# Patient Record
Sex: Male | Born: 1985 | Race: White | Hispanic: No | Marital: Married | Smoking: Never smoker
Health system: Southern US, Community
[De-identification: ages and names within clinical notes are randomized; demographics above are authoritative.]

## PROBLEM LIST (undated history)

## (undated) DIAGNOSIS — E785 Hyperlipidemia, unspecified: Secondary | ICD-10-CM

## (undated) HISTORY — PX: VASECTOMY: SHX75

## (undated) HISTORY — DX: Hyperlipidemia, unspecified: E78.5

## (undated) HISTORY — PX: HAND SURGERY: SHX662

---

## 2008-02-19 ENCOUNTER — Encounter (INDEPENDENT_AMBULATORY_CARE_PROVIDER_SITE_OTHER): Payer: Self-pay | Admitting: Orthopedic Surgery

## 2008-02-19 ENCOUNTER — Ambulatory Visit (HOSPITAL_BASED_OUTPATIENT_CLINIC_OR_DEPARTMENT_OTHER): Admission: RE | Admit: 2008-02-19 | Discharge: 2008-02-19 | Payer: Self-pay | Admitting: Orthopedic Surgery

## 2010-06-06 ENCOUNTER — Encounter: Payer: Self-pay | Admitting: Orthopedic Surgery

## 2010-09-27 NOTE — Op Note (Signed)
Jason Osborne, Jason Osborne                ACCOUNT NO.:  [id number]   MEDICAL RECORD NO.:  [id number]          PATIENT TYPE:  AMB   LOCATION:  DSC                          FACILITY:  [facility]   PHYSICIAN:  Cindee Salt, M.D.       DATE OF BIRTH:  January 06, 1986   DATE OF PROCEDURE:  02/19/2008  DATE OF DISCHARGE:                               OPERATIVE REPORT   PREOPERATIVE DIAGNOSIS:  Arteriovenous malformation, left palm.   POSTOPERATIVE DIAGNOSIS:  Arteriovenous malformation, left palm.   OPERATION:  Excision arteriovenous malformation, left thenar eminence.   SURGEON:  Cindee Salt, MD   ASSISTANT:  Artist Pais. Mina Marble, MD   ANESTHESIA:  General.   ANESTHESIOLOGIST:  Zenon Mayo, MD   HISTORY:  The patient is a 25 year old male with a history of a vascular  mass in the thenar eminence into the palm of his left hand.  He is  desirous of surgical excision.  He is aware of risks and complications  including infection, recurrence injury to arteries, nerves, tendons,  especially the palmar cutaneous branch of the median nerve to his  directly in line with the tumor, possibility of recurrence extension,  possibility of dystrophic response, and skin loss.  He has elected to  proceed to have this taken out due to the fact that it is enlarged over  the past several years.  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   PROCEDURE:  The patient is seen in the preoperative area.  The questions  were encouraged and answered.  The extremity marked by both the patient  and surgeon.  Antibiotic given.   PROCEDURE:  The patient was brought to the operating room and a  supraclavicular block was given for pain control.  Postoperatively, a  general anesthetic was given during the procedure under the direction of  Dr. Sampson Goon.  He was prepped using DuraPrep, supine position, left  arm free.  A time-out was taken.  The limb was exsanguinated from the  wrist proximally.  A tourniquet was  placed on the upper arm inflated to  250 mmHg.  A curvilinear incision was made over the mass due to his  position and carried down through the subcutaneous tissue.  Mass was  immediately encountered.  A beefy red multilobulated mass of significant  size was immediately encountered.  With blunt and sharp dissection, this  was dissected free, attempting to protect any neurovascular structure,  bleeders centering, and were cauterized with bipolar.  Dissection was  carried down over the entire metacarpal into the thenar eminence and  over into the superficial to the flexor retinaculum.  Multiple lobulated  masses were removed and sent to pathology.  The wound was copiously  irrigated with saline.  The skin flaps, which were undermined were  cauterized as necessary.  A vessel loop drain was placed to the depths  of each flap and brought out proximally.  The wound closed with  interrupted 4-0 Vicryl Rapide sutures.  A sterile compressive dressing  with thumb spica splint  was applied.  The patient tolerated the procedure  well and was taken to  the recovery room for observation in satisfactory condition.  He will be  discharged to home and return to [facility] in 1 week on  Vicodin.           ______________________________  Cindee Salt, M.D.     GK/MEDQ  D:  02/19/2008  T:  02/20/2008  Job:  [id number]

## 2010-11-10 ENCOUNTER — Inpatient Hospital Stay (INDEPENDENT_AMBULATORY_CARE_PROVIDER_SITE_OTHER)
Admission: RE | Admit: 2010-11-10 | Discharge: 2010-11-10 | Disposition: A | Discharge status: Home / Self Care | Payer: 59 | Source: Ambulatory Visit | Attending: Family Medicine | Admitting: Family Medicine

## 2010-11-10 DIAGNOSIS — J019 Acute sinusitis, unspecified: Secondary | ICD-10-CM

## 2011-02-14 LAB — POCT HEMOGLOBIN-HEMACUE: Hemoglobin: 16.4

## 2013-02-27 ENCOUNTER — Encounter: Payer: Self-pay | Admitting: *Deleted

## 2013-02-27 ENCOUNTER — Other Ambulatory Visit: Payer: Self-pay | Admitting: *Deleted

## 2013-02-27 DIAGNOSIS — E785 Hyperlipidemia, unspecified: Secondary | ICD-10-CM

## 2013-02-27 DIAGNOSIS — R5381 Other malaise: Secondary | ICD-10-CM

## 2013-02-27 DIAGNOSIS — Z8249 Family history of ischemic heart disease and other diseases of the circulatory system: Secondary | ICD-10-CM

## 2013-02-27 DIAGNOSIS — Z79899 Other long term (current) drug therapy: Secondary | ICD-10-CM

## 2013-03-03 ENCOUNTER — Telehealth: Payer: Self-pay | Admitting: Cardiovascular Disease

## 2013-03-03 DIAGNOSIS — Z8249 Family history of ischemic heart disease and other diseases of the circulatory system: Secondary | ICD-10-CM

## 2013-03-03 DIAGNOSIS — E785 Hyperlipidemia, unspecified: Secondary | ICD-10-CM

## 2013-03-03 DIAGNOSIS — R5381 Other malaise: Secondary | ICD-10-CM

## 2013-03-03 DIAGNOSIS — Z79899 Other long term (current) drug therapy: Secondary | ICD-10-CM

## 2013-03-03 LAB — COMPREHENSIVE METABOLIC PANEL
AST: 30 U/L (ref 0–37)
Albumin: 4.5 g/dL (ref 3.5–5.2)
Alkaline Phosphatase: 69 U/L (ref 39–117)
BUN: 12 mg/dL (ref 6–23)
Creat: 1.02 mg/dL (ref 0.50–1.35)
Glucose, Bld: 93 mg/dL (ref 70–99)
Total Bilirubin: 1 mg/dL (ref 0.3–1.2)

## 2013-03-03 LAB — LIPID PANEL
Cholesterol: 172 mg/dL (ref 0–200)
HDL: 45 mg/dL (ref >39)
LDL Cholesterol: 112 mg/dL — ABNORMAL HIGH (ref 0–99)
Total CHOL/HDL Ratio: 3.8 Ratio
VLDL: 15 mg/dL (ref 0–40)

## 2013-03-03 LAB — CBC
HCT: 44.8 % (ref 39.0–52.0)
Hemoglobin: 16 g/dL (ref 13.0–17.0)
MCH: 30.9 pg (ref 26.0–34.0)
MCHC: 35.7 g/dL (ref 30.0–36.0)
MCV: 86.5 fL (ref 78.0–100.0)
RBC: 5.18 MIL/uL (ref 4.22–5.81)
WBC: 5.3 10*3/uL (ref 4.0–10.5)

## 2013-03-03 NOTE — Telephone Encounter (Signed)
Erin w/ [facility] called.  Stated no orders for pt.  Chart reviewed and orders released.

## 2013-03-05 ENCOUNTER — Ambulatory Visit (INDEPENDENT_AMBULATORY_CARE_PROVIDER_SITE_OTHER): Payer: PRIVATE HEALTH INSURANCE | Admitting: Cardiovascular Disease

## 2013-03-05 ENCOUNTER — Encounter: Payer: Self-pay | Admitting: Cardiovascular Disease

## 2013-03-05 VITALS — BP 110/90 | HR 80 | Ht 68.0 in | Wt 216.9 lb

## 2013-03-05 DIAGNOSIS — E785 Hyperlipidemia, unspecified: Secondary | ICD-10-CM

## 2013-03-05 DIAGNOSIS — E782 Mixed hyperlipidemia: Secondary | ICD-10-CM

## 2013-03-05 MED ORDER — ROSUVASTATIN CALCIUM 10 MG PO TABS: 10.0000 mg | ORAL_TABLET | Freq: Every day | ORAL | Status: DC

## 2013-03-05 NOTE — Patient Instructions (Signed)
Your physician recommends that you schedule a follow-up appointment in: 1 YEAR.  Your physician has recommended you make the following change in your medication: Co Q10 200mg  - 300mg  daily toe help with muscle pain.

## 2013-03-05 NOTE — Progress Notes (Addendum)
Patient ID: Jason Osborne, male   DOB: January 16, 1986, 27 y.o.   MRN: [id number]     HPI: Jason Osborne is a 27 y.o. male who presents to the office today for a one-year cardiology evaluation.  Jason Osborne is an 27 year old male who has a history of marked hyperlipidemia with remote cholesterols in excess of 400 which was first recognized when he was approximately 14 or 27 years old. He was started on statins at age 9. While he had been on Lipitor. He had developed some mild ST PT elevation. More recently he's been on lower dose Crestor as well as combination with Zetia. Does have strong family history for coronary artery disease in that his father died of myocardial infarction at age 86. Multiple family members wife taking care of had coronary artery disease as well as significant aortic valve stenosis. He states that his insurance company had recently changed. He been out of Crestor for one week and did note some slight improvement in the vague foot discomfort. He is now back taking Crestor 10 mg and Zetia 10 mg.   Past Medical History  Diagnosis Date  . Hyperlipidemia     History reviewed. No pertinent past surgical history.  Allergies  Allergen Reactions  . Crestor [Rosuvastatin]     Myalgias with high doses    Current Outpatient Prescriptions  Medication Sig Dispense Refill  . aspirin 81 MG tablet Take 81 mg by mouth daily.      Marland Kitchen ezetimibe (ZETIA) 10 MG tablet Take 10 mg by mouth daily.      Marland Kitchen GLUCOSAMINE HCL-MSM PO Take 1 capsule by mouth daily.      . rosuvastatin (CRESTOR) 10 MG tablet Take 10 mg by mouth daily.      . vitamin C (ASCORBIC ACID) 500 MG tablet Take 500 mg by mouth daily.      . rosuvastatin (CRESTOR) 10 MG tablet Take 1 tablet (10 mg total) by mouth daily.  42 tablet  0   No current facility-administered medications for this visit.    History   Social History  . Marital Status: Married    Spouse Name: N/A    Number of Children: N/A  . Years of Education: N/A    Occupational History  . Not on file.   Social History Main Topics  . Smoking status: Never Smoker   . Smokeless tobacco: Never Used  . Alcohol Use: Not on file  . Drug Use: No  . Sexual Activity: Not on file   Other Topics Concern  . Not on file   Social History Narrative  . No narrative on file    Family History  Problem Relation Age of Onset  . Hypertension Mother   . Heart attack Father 79  . Heart disease Father   . Hyperlipidemia Brother   . Hyperlipidemia Maternal Grandfather   . Hypertension Maternal Grandfather   . Cancer - Lung Maternal Grandfather   . Heart disease Paternal Grandfather   . Hyperlipidemia Paternal Grandfather   . Hypertension Paternal Grandfather   . Melanoma Paternal Grandfather   . Hyperlipidemia Brother    Additional social history is notable that he is married. He now has 3 children, ages 59 months, 86 years old, and 27 years old. 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   ROS is negative for fevers, chills or night sweats. He denies palpitations. He denies chest pain. He denies shortness of breath. He denies abdominal pain. He denies muscle soreness. At  times he has noted some vague short-lived myalgias. He denies any hematuria or hematochezia. He denies bleeding.  Other comprehensive 12 point system review is negative.  PE BP 110/90  Pulse 80  Ht 5\' 8"  (1.727 m)  Wt 216 lb 14.4 oz (98.385 kg)  BMI 32.99 kg/m2  Repeat blood pressure when taken by me 120/78 General: Alert, oriented, no distress.  Skin: normal turgor, no rashes HEENT: Normocephalic, atraumatic. Pupils round and reactive; sclera anicteric;no lid lag.  Nose without nasal septal hypertrophy Mouth/Parynx benign; Mallinpatti scale 2 Neck: No JVD, no carotid briuts Lungs: clear to ausculatation and percussion; no wheezing or rales Heart: RRR, s1 s2 normal  No chest wall tenderness Abdomen: soft, nontender; no hepatosplenomehaly, BS+; abdominal aorta nontender  and not dilated by palpation. Pulses 2+ Extremities: no clubbing cyanosis or edema, Homan's sign negative ; no muscle tenderness to palpation  Neurologic: grossly nonfocal Psychologic: normal affect and mood.  ECG: Normal sinus rhythm at 80 beats per minute  LABS:  BMET    Component Value Date/Time   NA 139 03/03/2013 0827   K 4.0 03/03/2013 0827   CL 105 03/03/2013 0827   CO2 26 03/03/2013 0827   GLUCOSE 93 03/03/2013 0827   BUN 12 03/03/2013 0827   CREATININE 1.02 03/03/2013 0827   CALCIUM 9.9 03/03/2013 0827     Hepatic Function Panel     Component Value Date/Time   PROT 7.1 03/03/2013 0827   ALBUMIN 4.5 03/03/2013 0827   AST 30 03/03/2013 0827   ALT 58* 03/03/2013 0827   ALKPHOS 69 03/03/2013 0827   BILITOT 1.0 03/03/2013 0827     CBC    Component Value Date/Time   WBC 5.3 03/03/2013 0827   RBC 5.18 03/03/2013 0827   HGB 16.0 03/03/2013 0827   HCT 44.8 03/03/2013 0827   PLT 209 03/03/2013 0827   MCV 86.5 03/03/2013 0827   MCH 30.9 03/03/2013 0827   MCHC 35.7 03/03/2013 0827   RDW 13.4 03/03/2013 0827     BNP No results found for this basename: probnp    Lipid Panel     Component Value Date/Time   CHOL 172 03/03/2013 0827   TRIG 74 03/03/2013 0827   HDL 45 03/03/2013 0827   CHOLHDL 3.8 03/03/2013 0827   VLDL 15 03/03/2013 0827   LDLCALC 112* 03/03/2013 0827     RADIOLOGY: No results found.    ASSESSMENT AND PLAN: Mr. [name] continues to do fairly well on his current therapy. He now is on low dose Crestor 10 mg in combination with Zetia 10 mg. His most recent LDL is 112 and total cholesterol 172 which is markedly improved from his levels greater than 400 when he was a teenager. He did have very minimal ALT elevation at 58 and AST was normal. I have suggested he try coenzyme Q10 for additional supplementation which may help potential intermittent myalgias. I recommended followup laboratory obtained in 6 months. I will see him in one year for  followup evaluation in the office.   [name], MD, [facility]  03/05/2013 8:31 AM

## 2013-03-06 ENCOUNTER — Encounter: Payer: Self-pay | Admitting: Cardiovascular Disease

## 2013-04-02 ENCOUNTER — Other Ambulatory Visit: Payer: Self-pay | Admitting: *Deleted

## 2013-04-02 ENCOUNTER — Telehealth: Payer: Self-pay | Admitting: *Deleted

## 2013-04-02 DIAGNOSIS — R7989 Other specified abnormal findings of blood chemistry: Secondary | ICD-10-CM

## 2013-04-02 NOTE — Telephone Encounter (Signed)
Left message liver function slightly elevated. Recheck in 2 weeks.lab slip will be given to Pattricia Boss.

## 2013-05-05 ENCOUNTER — Other Ambulatory Visit: Payer: Self-pay | Admitting: Cardiovascular Disease

## 2013-05-06 NOTE — Telephone Encounter (Signed)
Rx was sent to pharmacy electronically. 

## 2013-05-29 ENCOUNTER — Encounter (HOSPITAL_COMMUNITY): Payer: Self-pay | Admitting: Emergency Medicine

## 2013-05-29 ENCOUNTER — Emergency Department (HOSPITAL_COMMUNITY)
Admission: EM | Admit: 2013-05-29 | Discharge: 2013-05-29 | Disposition: A | Discharge status: Home / Self Care | Payer: PRIVATE HEALTH INSURANCE | Source: Home / Self Care | Attending: Family Medicine | Admitting: Family Medicine

## 2013-05-29 DIAGNOSIS — J329 Chronic sinusitis, unspecified: Secondary | ICD-10-CM

## 2013-05-29 MED ORDER — PREDNISONE 10 MG PO TABS: 30.0000 mg | ORAL_TABLET | Freq: Every day | ORAL | Status: DC

## 2013-05-29 MED ORDER — IPRATROPIUM BROMIDE 0.06 % NA SOLN: 2.0000 | Freq: Four times a day (QID) | NASAL | Status: DC

## 2013-05-29 MED ORDER — AMOXICILLIN-POT CLAVULANATE 875-125 MG PO TABS: 1.0000 | ORAL_TABLET | Freq: Two times a day (BID) | ORAL | Status: DC

## 2013-05-29 NOTE — Discharge Instructions (Signed)
Thank you for coming in today. Take both prednisone and Augmentin.  Use up to 2 aleve twice daily for pain.  Use the nasal spray as directed.  Followup with [facility] if not getting better  Sinusitis Sinusitis is redness, soreness, and swelling (inflammation) of the paranasal sinuses. Paranasal sinuses are air pockets within the bones of your face (beneath the eyes, the middle of the forehead, or above the eyes). In healthy paranasal sinuses, mucus is able to drain out, and air is able to circulate through them by way of your nose. However, when your paranasal sinuses are inflamed, mucus and air can become trapped. This can allow bacteria and other germs to grow and cause infection. Sinusitis can develop quickly and last only a short time (acute) or continue over a long period (chronic). Sinusitis that lasts for more than 12 weeks is considered chronic.  CAUSES  Causes of sinusitis include:  Allergies.  Structural abnormalities, such as displacement of the cartilage that separates your nostrils (deviated septum), which can decrease the air flow through your nose and sinuses and affect sinus drainage.  Functional abnormalities, such as when the small hairs (cilia) that line your sinuses and help remove mucus do not work properly or are not present. SYMPTOMS  Symptoms of acute and chronic sinusitis are the same. The primary symptoms are pain and pressure around the affected sinuses. Other symptoms include:  Upper toothache.  Earache.  Headache.  Bad breath.  Decreased sense of smell and taste.  A cough, which worsens when you are lying flat.  Fatigue.  Fever.  Thick drainage from your nose, which often is green and may contain pus (purulent).  Swelling and warmth over the affected sinuses. DIAGNOSIS  Your caregiver will perform a physical exam. During the exam, your caregiver may:  Look in your nose for signs of abnormal growths in your nostrils (nasal polyps).  Tap  over the affected sinus to check for signs of infection.  View the inside of your sinuses (endoscopy) with a special imaging device with a light attached (endoscope), which is inserted into your sinuses. If your caregiver suspects that you have chronic sinusitis, one or more of the following tests may be recommended:  Allergy tests.  Nasal culture A sample of mucus is taken from your nose and sent to a lab and screened for bacteria.  Nasal cytology A sample of mucus is taken from your nose and examined by your caregiver to determine if your sinusitis is related to an allergy. TREATMENT  Most cases of acute sinusitis are related to a viral infection and will resolve on their own within 10 days. Sometimes medicines are prescribed to help relieve symptoms (pain medicine, decongestants, nasal steroid sprays, or saline sprays).  However, for sinusitis related to a bacterial infection, your caregiver will prescribe antibiotic medicines. These are medicines that will help kill the bacteria causing the infection.  Rarely, sinusitis is caused by a fungal infection. In theses cases, your caregiver will prescribe antifungal medicine. For some cases of chronic sinusitis, surgery is needed. Generally, these are cases in which sinusitis recurs more than 3 times per year, despite other treatments. HOME CARE INSTRUCTIONS   Drink plenty of water. Water helps thin the mucus so your sinuses can drain more easily.  Use a humidifier.  Inhale steam 3 to 4 times a day (for example, sit in the bathroom with the shower running).  Apply a warm, moist washcloth to your face 3 to 4 times a day,  or as directed by your caregiver.  Use saline nasal sprays to help moisten and clean your sinuses.  Take over-the-counter or prescription medicines for pain, discomfort, or fever only as directed by your caregiver. SEEK IMMEDIATE MEDICAL CARE IF:  You have increasing pain or severe headaches.  You have nausea, vomiting,  or drowsiness.  You have swelling around your face.  You have vision problems.  You have a stiff neck.  You have difficulty breathing. MAKE SURE YOU:   Understand these instructions.  Will watch your condition.  Will get help right away if you are not doing well or get worse. Document Released: 05/01/2005 Document Revised: 07/24/2011 Document Reviewed: 05/16/2011 [facility] Patient Information 2014 [employer] [place].

## 2013-05-29 NOTE — ED Provider Notes (Signed)
Jason Osborne is a [age] y.o. male who presents to Urgent Care today for sinus congestion headache sinus pressure nasal congestion and sore throat for 3 weeks. Patient was getting better about 2 weeks ago when he gets sick again. Is left-sided worse than his right side. He's tried Sudafed and Tylenol which have not helped. No fevers chills nausea vomiting or diarrhea. Feels well otherwise  History of familial hyperlipidemia Past Medical History  Diagnosis Date  . Hyperlipidemia    History  Substance Use Topics  . Smoking status: Never Smoker   . Smokeless tobacco: Never Used  . Alcohol Use: Not on file   ROS as above Medications: No current facility-administered medications for this encounter.   Current Outpatient Prescriptions  Medication Sig Dispense Refill  . amoxicillin-clavulanate (AUGMENTIN) 875-125 MG per tablet Take 1 tablet by mouth every 12 (twelve) hours.  14 tablet  0  . aspirin 81 MG tablet Take 81 mg by mouth daily.      . CRESTOR 10 MG tablet TAKE 1 TABLET BY MOUTH EVERY DAY AT BEDTIME  30 tablet  10  . ezetimibe (ZETIA) 10 MG tablet Take 10 mg by mouth daily.      Marland Kitchen. GLUCOSAMINE HCL-MSM PO Take 1 capsule by mouth daily.      Marland Kitchen. ipratropium (ATROVENT) 0.06 % nasal spray Place 2 sprays into both nostrils 4 (four) times daily.  15 mL  1  . predniSONE (DELTASONE) 10 MG tablet Take 3 tablets (30 mg total) by mouth daily.  15 tablet  0  . vitamin C (ASCORBIC ACID) 500 MG tablet Take 500 mg by mouth daily.        Exam:  BP 124/83  Pulse 76  Temp(Src) 98.5 F (36.9 C) (Oral)  Resp 16  SpO2 99% Gen: Well NAD HEENT: EOMI,  MMM mild tender palpation left maxillary sinus. Is a turbinates are inflamed bilaterally. Posterior pharynx with cobblestoning. Tympanic membranes are normal appearing bilaterally Lungs: Normal work of breathing. CTABL Heart: RRR no MRG Abd: NABS, Soft. NT, ND Exts: Brisk capillary refill, warm and well perfused.    Assessment and Plan: 28 y.o. male  with sinusitis. Plan to treat with prednisone, and Augmentin. Will use over-the-counter pain medications as needed. [facility]  Discussed warning signs or symptoms. Please see discharge instructions. Patient expresses understanding.    Rodolph BongEvan S Corey, MD 05/29/13 [phone]

## 2013-05-29 NOTE — ED Notes (Signed)
C/o sinus problems States he has a sinus headache, sneezing, congestion, and has pressure in hand and face. OTC medications taking but no relief.

## 2013-06-27 ENCOUNTER — Other Ambulatory Visit: Payer: Self-pay | Admitting: *Deleted

## 2013-06-27 MED ORDER — EZETIMIBE 10 MG PO TABS: 10.0000 mg | ORAL_TABLET | Freq: Every day | ORAL | Status: DC

## 2013-07-02 ENCOUNTER — Other Ambulatory Visit: Payer: Self-pay | Admitting: *Deleted

## 2013-07-02 MED ORDER — ROSUVASTATIN CALCIUM 10 MG PO TABS: ORAL_TABLET | ORAL | Status: DC

## 2013-07-02 NOTE — Telephone Encounter (Signed)
Rx was sent to pharmacy electronically. 

## 2014-03-09 ENCOUNTER — Other Ambulatory Visit: Payer: Self-pay | Admitting: Cardiovascular Disease

## 2014-03-10 NOTE — Telephone Encounter (Signed)
Rx was sent to pharmacy electronically. 

## 2014-04-06 ENCOUNTER — Other Ambulatory Visit: Payer: Self-pay | Admitting: Cardiovascular Disease

## 2014-04-06 NOTE — Telephone Encounter (Signed)
Rx was sent to pharmacy electronically. 

## 2014-06-03 ENCOUNTER — Other Ambulatory Visit: Payer: Self-pay | Admitting: Cardiovascular Disease

## 2014-06-03 MED ORDER — ROSUVASTATIN CALCIUM 10 MG PO TABS: ORAL_TABLET | ORAL | Status: DC

## 2014-06-03 NOTE — Telephone Encounter (Signed)
Pt had refill request, also had appt set up. Refilled Rx to get him to appt date.

## 2014-08-13 ENCOUNTER — Encounter: Payer: Self-pay | Admitting: Cardiovascular Disease

## 2014-08-13 ENCOUNTER — Ambulatory Visit (INDEPENDENT_AMBULATORY_CARE_PROVIDER_SITE_OTHER): Payer: 59 | Admitting: Cardiovascular Disease

## 2014-08-13 VITALS — BP 108/80 | HR 65 | Ht 68.0 in | Wt 222.2 lb

## 2014-08-13 DIAGNOSIS — Z79899 Other long term (current) drug therapy: Secondary | ICD-10-CM

## 2014-08-13 DIAGNOSIS — E669 Obesity, unspecified: Secondary | ICD-10-CM

## 2014-08-13 DIAGNOSIS — E785 Hyperlipidemia, unspecified: Secondary | ICD-10-CM

## 2014-08-13 LAB — LIPID PANEL
Cholesterol: 167 mg/dL (ref 0–200)
HDL: 43 mg/dL (ref >=40)
LDL CALC: 113 mg/dL — AB (ref 0–99)
Total CHOL/HDL Ratio: 3.9 Ratio
Triglycerides: 57 mg/dL (ref <150)
VLDL: 11 mg/dL (ref 0–40)

## 2014-08-13 LAB — CBC
HCT: 45 % (ref 39.0–52.0)
Hemoglobin: 15.6 g/dL (ref 13.0–17.0)
MCH: 30.2 pg (ref 26.0–34.0)
MCHC: 34.7 g/dL (ref 30.0–36.0)
MCV: 87.2 fL (ref 78.0–100.0)
MPV: 10.9 fL (ref 8.6–12.4)
PLATELETS: 234 10*3/uL (ref 150–400)
RBC: 5.16 MIL/uL (ref 4.22–5.81)
RDW: 13.4 % (ref 11.5–15.5)
WBC: 5.3 10*3/uL (ref 4.0–10.5)

## 2014-08-13 LAB — COMPREHENSIVE METABOLIC PANEL
ALBUMIN: 4.5 g/dL (ref 3.5–5.2)
ALT: 64 U/L — AB (ref 0–53)
AST: 37 U/L (ref 0–37)
Alkaline Phosphatase: 82 U/L (ref 39–117)
BUN: 15 mg/dL (ref 6–23)
CALCIUM: 9.4 mg/dL (ref 8.4–10.5)
CO2: 23 meq/L (ref 19–32)
Chloride: 103 mEq/L (ref 96–112)
Creat: 0.83 mg/dL (ref 0.50–1.35)
Glucose, Bld: 90 mg/dL (ref 70–99)
POTASSIUM: 4.6 meq/L (ref 3.5–5.3)
Sodium: 141 mEq/L (ref 135–145)
TOTAL PROTEIN: 7.2 g/dL (ref 6.0–8.3)
Total Bilirubin: 1.1 mg/dL (ref 0.2–1.2)

## 2014-08-13 LAB — TSH: TSH: 0.771 u[IU]/mL (ref 0.350–4.500)

## 2014-08-13 NOTE — Patient Instructions (Addendum)
Your physician recommends that you return for fasting lab work.  Your physician wants you to follow-up in:1 year or sooner if needed. You will receive a reminder letter in the mail two months in advance. If you don't receive a letter, please call our office to schedule the follow-up appointment. 

## 2014-08-13 NOTE — Progress Notes (Signed)
Patient ID: Jason Osborne, male   DOB: 01/22/1986, 29 y.o.   MRN: [id number]     HPI: Jason Osborne is a 29 y.o. male who presents to the office today for an 32 month cardiology evaluation.  Cevin  has a history of marked hyperlipidemia with remote cholesterols in excess of 400 which was first recognized when he was approximately 65 or 29 years old. He was started on statins at age 55. When he had been on Lipitor he had developed some mild LFT elevation. More recently he's been on lower dose Crestor as well as combination with Zetia. He has strong family history for coronary artery disease in that his father died of myocardial infarction at age 42. Multiple family members  had coronary artery disease as well as significant aortic valve stenosis.   He now has 3 children.  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  He has noted some mild weight gain.  When I last saw him, I suggested coenzyme Q10 which may be helpful to reduce myalgias which he had experienced on higher dose of Crestor.   He presents to the office today for evaluation.   Past Medical History  Diagnosis Date  . Hyperlipidemia     History reviewed. No pertinent past surgical history.  Allergies  Allergen Reactions  . Crestor [Rosuvastatin]     Myalgias with high doses    Current Outpatient Prescriptions  Medication Sig Dispense Refill  . Ascorbic Acid (VITAMIN C) 1000 MG tablet Take 2,000 mg by mouth daily.    . Desloratadine (CLARINEX PO) Take 1 tablet by mouth daily.    . montelukast (SINGULAIR) 10 MG tablet Take 10 mg by mouth at bedtime.    . NON FORMULARY Allergy injection    . rosuvastatin (CRESTOR) 10 MG tablet TAKE 1 TABLET BY MOUTH EVERY DAY AT BEDTIME 30 tablet 2  . ZETIA 10 MG tablet TAKE 1 TABLET BY MOUTH DAILY. PLEASE MAKE APPOINTMENT FOR REFILLS 30 tablet 2   No current facility-administered medications for this visit.     History   Social History  . Marital Status: Married    Spouse Name: N/A  . Number of Children: N/A  . Years of Education: N/A   Occupational History  . Not on file.   Social History Main Topics  . Smoking status: Never Smoker   . Smokeless tobacco: Never Used  . Alcohol Use: Not on file  . Drug Use: No  . Sexual Activity: Not on file   Other Topics Concern  . Not on file   Social History Narrative    Family History  Problem Relation Age of Onset  . Hypertension Mother   . Heart attack Father 40  . Heart disease Father   . Hyperlipidemia Brother   . Hyperlipidemia Maternal Grandfather   . Hypertension Maternal Grandfather   . Cancer - Lung Maternal Grandfather   . Heart disease Paternal Grandfather   . Hyperlipidemia Paternal Grandfather   . Hypertension Paternal Grandfather   . Melanoma Paternal Grandfather   . Hyperlipidemia Brother    Additional social history is notable that he is married. He now has 3 children, ages 26 months, 11 years old, and 29 years old. 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  ROS General: Negative; No fevers, chills, or night sweats;  HEENT: Negative; No changes in vision or hearing, sinus congestion, difficulty swallowing  Pulmonary: Negative; No cough, wheezing, shortness of breath, hemoptysis Cardiovascular: Negative; No chest pain, presyncope, syncope, palpitations GI: Negative; No nausea, vomiting, diarrhea, or abdominal pain GU: Negative; No dysuria, hematuria, or difficulty voiding Musculoskeletal: Negative; no myalgias, joint pain, or weakness Hematologic/Oncology: Negative; no easy bruising, bleeding Endocrine: Negative; no heat/cold intolerance; no diabetes Neuro: Negative; no changes in balance, headaches Skin: Negative; No rashes or skin lesions Psychiatric: Negative; No behavioral problems, depression Sleep: Negative; No snoring, daytime sleepiness, hypersomnolence, bruxism, restless legs, hypnogognic  hallucinations, no cataplexy Other comprehensive 14 point system review is negative.   PE BP 108/80 mmHg  Pulse 65  Ht 5\' 8"  (1.727 m)  Wt 222 lb 3.2 oz (100.789 kg)  BMI 33.79 kg/m2  Repeat blood pressure when taken by me 110/78 General: Alert, oriented, no distress.  Skin: normal turgor, no rashes HEENT: Normocephalic, atraumatic. Pupils round and reactive; sclera anicteric;no lid lag.  Nose without nasal septal hypertrophy Mouth/Parynx benign; Mallinpatti scale 2 Neck: No JVD, no carotid bruits Chest wall: Nontender to palpation Lungs: clear to ausculatation and percussion; no wheezing or rales Heart: RRR, s1 s2 normal; no S3 or S4 gallop.  No significant murmur.  No rubs thrills or heaves.  No chest wall tenderness Abdomen: soft, nontender; no hepatosplenomehaly, BS+; abdominal aorta nontender and not dilated by palpation. Back: No CVA tenderness Pulses 2+ Extremities: no clubbing cyanosis or edema, Homan's sign negative ; no muscle tenderness to palpation  Neurologic: grossly nonfocal Psychologic: normal affect and mood.  ECG (independently read by me, and (: Normal sinus rhythm with mild sinus arrhythmia.  Heart rate 65 bpm.  Normal intervals.  Nondiagnostic T changes in lead 3.  October 2014 ECG: Normal sinus rhythm at 80 beats per minute  LABS:  BMET    Component Value Date/Time   NA 139 03/03/2013 0827   K 4.0 03/03/2013 0827   CL 105 03/03/2013 0827   CO2 26 03/03/2013 0827   GLUCOSE 93 03/03/2013 0827   BUN 12 03/03/2013 0827   CREATININE 1.02 03/03/2013 0827   CALCIUM 9.9 03/03/2013 0827     Hepatic Function Panel     Component Value Date/Time   PROT 7.1 03/03/2013 0827   ALBUMIN 4.5 03/03/2013 0827   AST 30 03/03/2013 0827   ALT 58* 03/03/2013 0827   ALKPHOS 69 03/03/2013 0827   BILITOT 1.0 03/03/2013 0827     CBC    Component Value Date/Time   WBC 5.3 03/03/2013 0827   RBC 5.18 03/03/2013 0827   HGB 16.0 03/03/2013 0827   HCT 44.8  03/03/2013 0827   PLT 209 03/03/2013 0827   MCV 86.5 03/03/2013 0827   MCH 30.9 03/03/2013 0827   MCHC 35.7 03/03/2013 0827   RDW 13.4 03/03/2013 0827     BNP No results found for: PROBNP  Lipid Panel     Component Value Date/Time   CHOL 172 03/03/2013 0827   TRIG 74 03/03/2013 0827   HDL 45 03/03/2013 0827   CHOLHDL 3.8 03/03/2013 0827   VLDL 15 03/03/2013 0827   LDLCALC 112* 03/03/2013 0827     RADIOLOGY: No results found.    ASSESSMENT AND PLAN: Mr. Bertell MariaJoshua Hagos has a history of marked hyperlipidemia with cholesterols in excess of 400 during his teenage years, suggesting familial hyperlipidemia.  He continues to do fairly well on his current therapy. He now is on low dose Crestor 10 mg in combination with Zetia 10 mg.  he has not had any laboratory since October  2014.  He is fasting today.  I will obtain a complete set of blood work today.  I had a long discussion with him concerning the new monoclonal antibody PCSK9 inhibitor therapy that is available, particularly in patients with familial hyperlipidemia.  He may be a candidate for enrollment in the ongoing PCSK9 inhibitor drug study since in the past he has been  unable to be titrated to higher dose of statins due to either LFT elevation, or myalgias.  I will contact him regarding the blood work an additional discussion will be made at that time.  I also discussed with him the importance of weight loss and exercise.  His body mass index is 33.8.  His blood pressure is stable.  [name], MD, [facility]  08/13/2014 1:24 PM

## 2014-09-08 ENCOUNTER — Encounter: Payer: Self-pay | Admitting: *Deleted

## 2014-09-11 ENCOUNTER — Other Ambulatory Visit: Payer: Self-pay | Admitting: Cardiovascular Disease

## 2014-09-11 NOTE — Telephone Encounter (Signed)
Rx(s) sent to pharmacy electronically.  

## 2014-10-05 ENCOUNTER — Other Ambulatory Visit: Payer: Self-pay | Admitting: Cardiovascular Disease

## 2014-10-05 NOTE — Telephone Encounter (Signed)
Rx has been sent to the pharmacy electronically. ° °

## 2015-03-02 ENCOUNTER — Encounter (HOSPITAL_COMMUNITY): Payer: Self-pay | Admitting: Emergency Medicine

## 2015-03-02 ENCOUNTER — Emergency Department (HOSPITAL_COMMUNITY)
Admission: EM | Admit: 2015-03-02 | Discharge: 2015-03-02 | Disposition: A | Discharge status: Home / Self Care | Payer: 59 | Source: Home / Self Care | Attending: Emergency Medicine | Admitting: Emergency Medicine

## 2015-03-02 DIAGNOSIS — J018 Other acute sinusitis: Secondary | ICD-10-CM | POA: Diagnosis not present

## 2015-03-02 MED ORDER — PREDNISONE 50 MG PO TABS: ORAL_TABLET | ORAL | Status: DC

## 2015-03-02 MED ORDER — FLUTICASONE PROPIONATE 50 MCG/ACT NA SUSP: 2.0000 | Freq: Every day | NASAL | Status: DC

## 2015-03-02 MED ORDER — TRIAMCINOLONE ACETONIDE 40 MG/ML IJ SUSP
40.0000 mg | Freq: Once | INTRAMUSCULAR | Status: AC
  Administered 2015-03-02: 40 mg via INTRAMUSCULAR

## 2015-03-02 MED ORDER — TRIAMCINOLONE ACETONIDE 40 MG/ML IJ SUSP
INTRAMUSCULAR | Status: AC
  Filled 2015-03-02: qty 1

## 2015-03-02 NOTE — ED Provider Notes (Signed)
CSN: [id number]     Arrival date & time 03/02/15  1716 History   First MD Initiated Contact with Patient 03/02/15 1744     Chief Complaint  Patient presents with  . URI   (Consider location/radiation/quality/duration/timing/severity/associated sxs/prior Treatment) HPI  He is a 29 year old man here for evaluation of nasal congestion. He states the last 3 days he has had nasal congestion, rhinorrhea, bilateral ear discomfort, and sinus pressure. He denies any fevers. No sore throat. He has had sneezing. No cough or shortness of breath. He is taking Singulair and Clarinex for seasonal allergies. He has not used any nasal sprays.  Past Medical History  Diagnosis Date  . Hyperlipidemia    Past Surgical History  Procedure Laterality Date  . Hand surgery Left    Family History  Problem Relation Age of Onset  . Hypertension Mother   . Heart attack Father 46  . Heart disease Father   . Hyperlipidemia Brother   . Hyperlipidemia Maternal Grandfather   . Hypertension Maternal Grandfather   . Cancer - Lung Maternal Grandfather   . Heart disease Paternal Grandfather   . Hyperlipidemia Paternal Grandfather   . Hypertension Paternal Grandfather   . Melanoma Paternal Grandfather   . Hyperlipidemia Brother    Social History  Substance Use Topics  . Smoking status: Never Smoker   . Smokeless tobacco: Never Used  . Alcohol Use: No    Review of Systems As in history of present illness Allergies  Crestor  Home Medications   Prior to Admission medications   Medication Sig Start Date End Date Taking? Authorizing Provider  pseudoephedrine (SUDAFED) 30 MG tablet Take 30 mg by mouth every 4 (four) hours as needed for congestion.   Yes Historical Provider, MD  Ascorbic Acid (VITAMIN C) 1000 MG tablet Take 2,000 mg by mouth daily.    Historical Provider, MD  CRESTOR 10 MG tablet TAKE 1 TABLET BY MOUTH EVERY DAY AT BEDTIME 10/05/14   [name], MD  Desloratadine (CLARINEX PO) Take 1  tablet by mouth daily.    Historical Provider, MD  ezetimibe (ZETIA) 10 MG tablet Take 1 tablet (10 mg total) by mouth daily. 09/11/14   [name], MD  fluticasone (FLONASE) 50 MCG/ACT nasal spray Place 2 sprays into both nostrils daily. 03/02/15   Charm Rings, MD  montelukast (SINGULAIR) 10 MG tablet Take 10 mg by mouth at bedtime.    Historical Provider, MD  NON FORMULARY Allergy injection    Historical Provider, MD  predniSONE (DELTASONE) 50 MG tablet Take 1 pill daily for 5 days. 03/02/15   Charm Rings, MD   Meds Ordered and Administered this Visit   Medications  triamcinolone acetonide (KENALOG-40) injection 40 mg (not administered)    BP 119/86 mmHg  Pulse 75  Temp(Src) 98.1 F (36.7 C) (Oral)  Resp 20  SpO2 95% No data found.   Physical Exam  Constitutional: He appears well-developed and well-nourished. No distress.  HENT:  Mouth/Throat: Oropharynx is clear and moist. No oropharyngeal exudate.  Nasal mucosa is erythematous and edematous. No sinus tenderness. TMs are normal bilaterally.  Eyes: Conjunctivae are normal.  Neck: Neck supple.  Cardiovascular: Normal rate, regular rhythm and normal heart sounds.   No murmur heard. Pulmonary/Chest: Effort normal and breath sounds normal. No respiratory distress. He has no wheezes. He has no rales.  Lymphadenopathy:    He has no cervical adenopathy.    ED Course  Procedures (including critical care time)  Labs Review Labs Reviewed - No data to display  Imaging Review No results found.    MDM   1. Other acute sinusitis    Likely combination of viral and allergic. He will continue his Singulair. I recommended doubling up the Clarinex. Dose of Kenalog given here. Prednisone for 5 days. Recommended Flonase and frequent use of nasal saline spray. Follow up as needed.    Charm RingsErin J Mikeila Burgen, MD 03/02/15 [phone]

## 2015-03-02 NOTE — ED Notes (Signed)
C/o symptoms that started 10/15.  C/o headache, stuffy nose, stuffy ears, blowing yellow sinus drainage in am.  Drainage clears throughout the day.  Denies fever.  No sore throat or ear pain.

## 2015-03-02 NOTE — Discharge Instructions (Signed)
Your sinuses are inflamed. This is likely due to a combination of allergies and a virus. Please continue your Singulair. Take your Clarinex twice a day for the next week. Take prednisone 1 pill daily starting tomorrow. Use Flonase daily for the next week. Use nasal saline spray or saline rinse as often as you can. Follow-up as needed.

## 2015-05-20 DIAGNOSIS — J301 Allergic rhinitis due to pollen: Secondary | ICD-10-CM | POA: Diagnosis not present

## 2015-05-20 DIAGNOSIS — J3089 Other allergic rhinitis: Secondary | ICD-10-CM | POA: Diagnosis not present

## 2015-05-20 DIAGNOSIS — J3081 Allergic rhinitis due to animal (cat) (dog) hair and dander: Secondary | ICD-10-CM | POA: Diagnosis not present

## 2015-06-02 DIAGNOSIS — J3089 Other allergic rhinitis: Secondary | ICD-10-CM | POA: Diagnosis not present

## 2015-06-02 DIAGNOSIS — J301 Allergic rhinitis due to pollen: Secondary | ICD-10-CM | POA: Diagnosis not present

## 2015-06-08 DIAGNOSIS — J3089 Other allergic rhinitis: Secondary | ICD-10-CM | POA: Diagnosis not present

## 2015-06-08 DIAGNOSIS — J3081 Allergic rhinitis due to animal (cat) (dog) hair and dander: Secondary | ICD-10-CM | POA: Diagnosis not present

## 2015-06-08 DIAGNOSIS — J301 Allergic rhinitis due to pollen: Secondary | ICD-10-CM | POA: Diagnosis not present

## 2015-06-15 DIAGNOSIS — J3089 Other allergic rhinitis: Secondary | ICD-10-CM | POA: Diagnosis not present

## 2015-06-15 DIAGNOSIS — J3081 Allergic rhinitis due to animal (cat) (dog) hair and dander: Secondary | ICD-10-CM | POA: Diagnosis not present

## 2015-06-15 DIAGNOSIS — J301 Allergic rhinitis due to pollen: Secondary | ICD-10-CM | POA: Diagnosis not present

## 2015-06-29 DIAGNOSIS — J3081 Allergic rhinitis due to animal (cat) (dog) hair and dander: Secondary | ICD-10-CM | POA: Diagnosis not present

## 2015-06-29 DIAGNOSIS — J3089 Other allergic rhinitis: Secondary | ICD-10-CM | POA: Diagnosis not present

## 2015-06-29 DIAGNOSIS — J301 Allergic rhinitis due to pollen: Secondary | ICD-10-CM | POA: Diagnosis not present

## 2015-07-09 DIAGNOSIS — J3089 Other allergic rhinitis: Secondary | ICD-10-CM | POA: Diagnosis not present

## 2015-07-09 DIAGNOSIS — J3081 Allergic rhinitis due to animal (cat) (dog) hair and dander: Secondary | ICD-10-CM | POA: Diagnosis not present

## 2015-07-09 DIAGNOSIS — J301 Allergic rhinitis due to pollen: Secondary | ICD-10-CM | POA: Diagnosis not present

## 2015-07-20 DIAGNOSIS — J3081 Allergic rhinitis due to animal (cat) (dog) hair and dander: Secondary | ICD-10-CM | POA: Diagnosis not present

## 2015-07-20 DIAGNOSIS — J3089 Other allergic rhinitis: Secondary | ICD-10-CM | POA: Diagnosis not present

## 2015-07-20 DIAGNOSIS — J301 Allergic rhinitis due to pollen: Secondary | ICD-10-CM | POA: Diagnosis not present

## 2015-07-26 ENCOUNTER — Other Ambulatory Visit: Payer: Self-pay | Admitting: Cardiovascular Disease

## 2015-07-26 MED FILL — DESLORATADINE 5 MG TABLET: 5 | 90 days supply | Qty: 90 | Fill #1

## 2015-07-26 MED FILL — MONTELUKAST SOD 10 MG TAB: 10 | 90 days supply | Qty: 90 | Fill #1

## 2015-07-26 MED FILL — ROSUVASTATIN CALCIUM 10 MG: 10 | 30 days supply | Qty: 30 | Fill #3

## 2015-07-26 NOTE — Telephone Encounter (Signed)
REFILL 

## 2015-07-29 DIAGNOSIS — J301 Allergic rhinitis due to pollen: Secondary | ICD-10-CM | POA: Diagnosis not present

## 2015-07-29 DIAGNOSIS — J3081 Allergic rhinitis due to animal (cat) (dog) hair and dander: Secondary | ICD-10-CM | POA: Diagnosis not present

## 2015-07-29 DIAGNOSIS — J3089 Other allergic rhinitis: Secondary | ICD-10-CM | POA: Diagnosis not present

## 2015-07-31 ENCOUNTER — Ambulatory Visit (INDEPENDENT_AMBULATORY_CARE_PROVIDER_SITE_OTHER): Payer: 59 | Admitting: Family Medicine

## 2015-07-31 ENCOUNTER — Telehealth: Payer: Self-pay

## 2015-07-31 VITALS — BP 120/76 | HR 77 | Temp 98.2°F | Resp 14 | Ht 68.0 in | Wt 226.0 lb

## 2015-07-31 DIAGNOSIS — R05 Cough: Secondary | ICD-10-CM | POA: Diagnosis not present

## 2015-07-31 DIAGNOSIS — R52 Pain, unspecified: Secondary | ICD-10-CM | POA: Diagnosis not present

## 2015-07-31 DIAGNOSIS — R69 Illness, unspecified: Principal | ICD-10-CM

## 2015-07-31 DIAGNOSIS — R059 Cough, unspecified: Secondary | ICD-10-CM

## 2015-07-31 DIAGNOSIS — J111 Influenza due to unidentified influenza virus with other respiratory manifestations: Secondary | ICD-10-CM | POA: Diagnosis not present

## 2015-07-31 DIAGNOSIS — Z20828 Contact with and (suspected) exposure to other viral communicable diseases: Secondary | ICD-10-CM | POA: Diagnosis not present

## 2015-07-31 MED ORDER — OSELTAMIVIR PHOSPHATE 75 MG PO CAPS: 75.0000 mg | ORAL_CAPSULE | Freq: Two times a day (BID) | ORAL | Status: DC

## 2015-07-31 NOTE — Telephone Encounter (Signed)
Pt was just seen by dr Neva Seatgreene and meds were  [facility] , [facility]   Best number [phone]

## 2015-07-31 NOTE — Addendum Note (Signed)
Addended by: Areta HaberMOREHEAD, Ariellah Faust B on: 07/31/2015 03:38 PM   Modules accepted: Orders

## 2015-07-31 NOTE — Progress Notes (Signed)
Subjective:    Patient ID: Jason Osborne, male    DOB: 1985-08-17, 30 y.o.   MRN: [id number] By signing my name below, I, Javier Dockerobert Ryan HalasBertell Osborne, attest that this documentation has been prepared under the direction and in the presence of Meredith StaggersJeffrey Ader Fritze, MD. Electronically Signed: Javier Dockerobert Ryan Halas, ER Scribe. 07/31/2015. 2:10 PM.  Chief Complaint  Patient presents with  . Chills    1 day - dtr diagnosed with flu x 2 days  . Fever  . Otalgia    pressure in both ears  . Nasal Congestion    HPI HPI Comments: Jason MariaJoshua Osborne is a 30 y.o. male who [facility] of myalgias, ear pain, HA, mild cough, sinus pressure, chills, and night sweats since last night. His daughter had flu last week, and his wife had the flu the week before. He had a fever of 99.5 last night.    Patient Active Problem List   Diagnosis Date Noted  . Mild obesity 08/13/2014  . Hyperlipidemia 03/05/2013   Past Medical History  Diagnosis Date  . Hyperlipidemia    Past Surgical History  Procedure Laterality Date  . Hand surgery Left   . Vasectomy     No Known Allergies Prior to Admission medications   Medication Sig Start Date End Date Taking? Authorizing Provider  Desloratadine (CLARINEX PO) Take 1 tablet by mouth daily.   Yes Historical Provider, MD  fluticasone (FLONASE) 50 MCG/ACT nasal spray Place 2 sprays into both nostrils daily. 03/02/15  Yes Charm RingsErin J Honig, MD  montelukast (SINGULAIR) 10 MG tablet Take 10 mg by mouth at bedtime.   Yes Historical Provider, MD  NON FORMULARY Allergy injection   Yes Historical Provider, MD  pseudoephedrine (SUDAFED) 30 MG tablet Take 30 mg by mouth every 4 (four) hours as needed for congestion.   Yes Historical Provider, MD  rosuvastatin (CRESTOR) 10 MG tablet TAKE 1 TABLET BY MOUTH AT BEDTIME 07/26/15  Yes Lennette Biharihomas A Kelly, MD  predniSONE (DELTASONE) 50 MG tablet Take 1 pill daily for 5 days. Patient not taking: Reported on 07/31/2015 03/02/15   Charm RingsErin J Honig, MD    Social History   Social History  . Marital Status: Married    Spouse Name: N/A  . Number of Children: N/A  . Years of Education: N/A   Occupational History  . Not on file.   Social History Main Topics  . Smoking status: Never Smoker   . Smokeless tobacco: Never Used  . Alcohol Use: No  . Drug Use: No  . Sexual Activity: Not on file   Other Topics Concern  . Not on file   Social History Narrative    Review of Systems  Constitutional: Positive for fever and chills.  HENT: Positive for congestion and sinus pressure.   Respiratory: Positive for cough. Negative for shortness of breath.   Musculoskeletal: Positive for myalgias.      Objective:  BP 120/76 mmHg  Pulse 77  Temp(Src) 98.2 F (36.8 C) (Oral)  Resp 14  Ht 5\' 8"  (1.727 m)  Wt 226 lb (102.513 kg)  BMI 34.37 kg/m2  SpO2 98%  Physical Exam  Constitutional: He is oriented to person, place, and time. He appears well-developed and well-nourished. No distress.  HENT:  Head: Normocephalic and atraumatic.  Right Ear: Tympanic membrane, external ear and ear canal normal.  Left Ear: Tympanic membrane, external ear and ear canal normal.  Nose: No rhinorrhea.  Mouth/Throat: Oropharynx is clear and moist and mucous  membranes are normal. No oropharyngeal exudate or posterior oropharyngeal erythema.  Edematous turbinates. Sinuses non tender. No lymphadenopathy.   Eyes: Conjunctivae are normal. Pupils are equal, round, and reactive to light.  Neck: Neck supple.  Cardiovascular: Normal rate, regular rhythm, normal heart sounds and intact distal pulses.   No murmur heard. Pulmonary/Chest: Effort normal and breath sounds normal. No respiratory distress. He has no wheezes. He has no rhonchi. He has no rales.  Abdominal: Soft. There is no tenderness.  Musculoskeletal: Normal range of motion.  Lymphadenopathy:    He has no cervical adenopathy.  Neurological: He is alert and oriented to person, place, and time. Coordination  normal.  Skin: Skin is warm and dry. No rash noted. He is not diaphoretic.  Psychiatric: He has a normal mood and affect. His behavior is normal.  Nursing note and vitals reviewed.     Assessment & Plan:   Jason Osborne is a 30 y.o. male Influenza-like illness - Plan: oseltamivir (TAMIFLU) 75 MG capsule  Exposure to influenza - Plan: oseltamivir (TAMIFLU) 75 MG capsule  Cough  Body aches   Exposure to influenza, with typical flu symptoms of cough and body aches. Discussed flu testing, but deferred based on typical symptoms and exposure to sick contact. Tamiflu discussed as option, chose to have Tamiflu prescription. Side effects discussed. Symptomatic care discussed, RTC precautions.   Meds ordered this encounter  Medications  . oseltamivir (TAMIFLU) 75 MG capsule    Sig: Take 1 capsule (75 mg total) by mouth 2 (two) times daily.    Dispense:  10 capsule    Refill:  0   Patient Instructions       IF you received an x-ray today, you will receive an invoice from [facility]. Please contact [facility] at [phone] with questions or concerns regarding your invoice.   IF you received labwork today, you will receive an invoice from [employer]. Please contact [facility] at [phone] with questions or concerns regarding your invoice.   Our billing staff will not be able to assist you with questions regarding bills from these companies.  You will be contacted with the lab results as soon as they are available. The fastest way to get your results is to activate your My Chart account. Instructions are located on the last page of this paperwork. If you have not heard from [place] regarding the results in 2 weeks, please contact this office.    Can start Tamiflu as discussed. Saline nasal spray at least 4 times per day if needed for nasal congestion, over the counter mucinex or mucinex DM as needed for cough, tylenol or ibuprofen over the  counter for fever and body aches, and drink plenty of fluids. Other information as in instructions below.  Return to the clinic or go to the nearest emergency room if any of your symptoms worsen or new symptoms occur.  Influenza, Adult Influenza ("the flu") is a viral infection of the respiratory tract. It occurs more often in winter months because people spend more time in close contact with one another. Influenza can make you feel very sick. Influenza easily spreads from person to person (contagious). CAUSES  Influenza is caused by a virus that infects the respiratory tract. You can catch the virus by breathing in droplets from an infected person's cough or sneeze. You can also catch the virus by touching something that was recently contaminated with the virus and then touching your mouth, nose, or eyes. RISKS AND COMPLICATIONS You  may be at risk for a more severe case of influenza if you smoke cigarettes, have diabetes, have chronic heart disease (such as heart failure) or lung disease (such as asthma), or if you have a weakened immune system. Elderly people and pregnant women are also at risk for more serious infections. The most common problem of influenza is a lung infection (pneumonia). Sometimes, this problem can require emergency medical care and may be life threatening. SIGNS AND SYMPTOMS  Symptoms typically last 4 to 10 days and may include:  Fever.  Chills.  Headache, body aches, and muscle aches.  Sore throat.  Chest discomfort and cough.  Poor appetite.  Weakness or feeling tired.  Dizziness.  Nausea or vomiting. DIAGNOSIS  Diagnosis of influenza is often made based on your history and a physical exam. A nose or throat swab test can be done to confirm the diagnosis. TREATMENT  In mild cases, influenza goes away on its own. Treatment is directed at relieving symptoms. For more severe cases, your health care provider may prescribe antiviral medicines to shorten the  sickness. Antibiotic medicines are not effective because the infection is caused by a virus, not by bacteria. HOME CARE INSTRUCTIONS  Take medicines only as directed by your health care provider.  Use a cool mist humidifier to make breathing easier.  Get plenty of rest until your temperature returns to normal. This usually takes 3 to 4 days.  Drink enough fluid to keep your urine clear or pale yellow.  Cover yourmouth and nosewhen coughing or sneezing,and wash your handswellto prevent thevirusfrom spreading.  Stay homefromwork orschool untilthe fever is gonefor at least 63full day. PREVENTION  An annual influenza vaccination (flu shot) is the best way to avoid getting influenza. An annual flu shot is now routinely recommended for all adults in the [place] SEEK MEDICAL CARE IF:  You experiencechest pain, yourcough worsens,or you producemore mucus.  Youhave nausea,vomiting, ordiarrhea.  Your fever returns or gets worse. SEEK IMMEDIATE MEDICAL CARE IF:  You havetrouble breathing, you become short of breath,or your skin ornails becomebluish.  You have severe painor stiffnessin the neck.  You develop a sudden headache, or pain in the face or ear.  You have nausea or vomiting that you cannot control. MAKE SURE YOU:   Understand these instructions.  Will watch your condition.  Will get help right away if you are not doing well or get worse.   This information is not intended to replace advice given to you by your health care provider. Make sure you discuss any questions you have with your health care provider.   Document Released: 04/28/2000 Document Revised: 05/22/2014 Document Reviewed: 07/31/2011 Elsevier Interactive Patient Education [employer]       I personally performed the services described in this documentation, which was scribed in my presence. The recorded information has been reviewed and considered, and addended by me as needed.

## 2015-07-31 NOTE — Patient Instructions (Addendum)
IF you received an x-ray today, you will receive an invoice from [facility]. Please contact [facility] at [phone] with questions or concerns regarding your invoice.   IF you received labwork today, you will receive an invoice from [employer]. Please contact [facility] at [phone] with questions or concerns regarding your invoice.   Our billing staff will not be able to assist you with questions regarding bills from these companies.  You will be contacted with the lab results as soon as they are available. The fastest way to get your results is to activate your My Chart account. Instructions are located on the last page of this paperwork. If you have not heard from [place] regarding the results in 2 weeks, please contact this office.    Can start Tamiflu as discussed. Saline nasal spray at least 4 times per day if needed for nasal congestion, over the counter mucinex or mucinex DM as needed for cough, tylenol or ibuprofen over the counter for fever and body aches, and drink plenty of fluids. Other information as in instructions below.  Return to the clinic or go to the nearest emergency room if any of your symptoms worsen or new symptoms occur.  Influenza, Adult Influenza ("the flu") is a viral infection of the respiratory tract. It occurs more often in winter months because people spend more time in close contact with one another. Influenza can make you feel very sick. Influenza easily spreads from person to person (contagious). CAUSES  Influenza is caused by a virus that infects the respiratory tract. You can catch the virus by breathing in droplets from an infected person's cough or sneeze. You can also catch the virus by touching something that was recently contaminated with the virus and then touching your mouth, nose, or eyes. RISKS AND COMPLICATIONS You may be at risk for a more severe case of influenza if you smoke cigarettes, have  diabetes, have chronic heart disease (such as heart failure) or lung disease (such as asthma), or if you have a weakened immune system. Elderly people and pregnant women are also at risk for more serious infections. The most common problem of influenza is a lung infection (pneumonia). Sometimes, this problem can require emergency medical care and may be life threatening. SIGNS AND SYMPTOMS  Symptoms typically last 4 to 10 days and may include:  Fever.  Chills.  Headache, body aches, and muscle aches.  Sore throat.  Chest discomfort and cough.  Poor appetite.  Weakness or feeling tired.  Dizziness.  Nausea or vomiting. DIAGNOSIS  Diagnosis of influenza is often made based on your history and a physical exam. A nose or throat swab test can be done to confirm the diagnosis. TREATMENT  In mild cases, influenza goes away on its own. Treatment is directed at relieving symptoms. For more severe cases, your health care provider may prescribe antiviral medicines to shorten the sickness. Antibiotic medicines are not effective because the infection is caused by a virus, not by bacteria. HOME CARE INSTRUCTIONS  Take medicines only as directed by your health care provider.  Use a cool mist humidifier to make breathing easier.  Get plenty of rest until your temperature returns to normal. This usually takes 3 to 4 days.  Drink enough fluid to keep your urine clear or pale yellow.  Cover yourmouth and nosewhen coughing or sneezing,and wash your handswellto prevent thevirusfrom spreading.  Stay homefromwork orschool untilthe fever is gonefor at least 30full day. PREVENTION  An  annual influenza vaccination (flu shot) is the best way to avoid getting influenza. An annual flu shot is now routinely recommended for all adults in the [place] SEEK MEDICAL CARE IF:  You experiencechest pain, yourcough worsens,or you producemore mucus.  Youhave nausea,vomiting, ordiarrhea.  Your  fever returns or gets worse. SEEK IMMEDIATE MEDICAL CARE IF:  You havetrouble breathing, you become short of breath,or your skin ornails becomebluish.  You have severe painor stiffnessin the neck.  You develop a sudden headache, or pain in the face or ear.  You have nausea or vomiting that you cannot control. MAKE SURE YOU:   Understand these instructions.  Will watch your condition.  Will get help right away if you are not doing well or get worse.   This information is not intended to replace advice given to you by your health care provider. Make sure you discuss any questions you have with your health care provider.   Document Released: 04/28/2000 Document Revised: 05/22/2014 Document Reviewed: 07/31/2011 Elsevier Interactive Patient Education [employer]2016 Elsevier Inc.

## 2015-08-20 DIAGNOSIS — J301 Allergic rhinitis due to pollen: Secondary | ICD-10-CM | POA: Diagnosis not present

## 2015-08-20 DIAGNOSIS — J3081 Allergic rhinitis due to animal (cat) (dog) hair and dander: Secondary | ICD-10-CM | POA: Diagnosis not present

## 2015-08-20 DIAGNOSIS — J3089 Other allergic rhinitis: Secondary | ICD-10-CM | POA: Diagnosis not present

## 2015-09-02 DIAGNOSIS — J301 Allergic rhinitis due to pollen: Secondary | ICD-10-CM | POA: Diagnosis not present

## 2015-09-02 DIAGNOSIS — J3081 Allergic rhinitis due to animal (cat) (dog) hair and dander: Secondary | ICD-10-CM | POA: Diagnosis not present

## 2015-09-02 DIAGNOSIS — J3089 Other allergic rhinitis: Secondary | ICD-10-CM | POA: Diagnosis not present

## 2015-09-02 MED FILL — ROSUVASTATIN CALCIUM 10 MG: 10 | 30 days supply | Qty: 30 | Fill #0

## 2015-09-15 DIAGNOSIS — J3081 Allergic rhinitis due to animal (cat) (dog) hair and dander: Secondary | ICD-10-CM | POA: Diagnosis not present

## 2015-09-15 DIAGNOSIS — J301 Allergic rhinitis due to pollen: Secondary | ICD-10-CM | POA: Diagnosis not present

## 2015-09-15 DIAGNOSIS — J3089 Other allergic rhinitis: Secondary | ICD-10-CM | POA: Diagnosis not present

## 2015-09-24 DIAGNOSIS — J301 Allergic rhinitis due to pollen: Secondary | ICD-10-CM | POA: Diagnosis not present

## 2015-09-24 DIAGNOSIS — J3081 Allergic rhinitis due to animal (cat) (dog) hair and dander: Secondary | ICD-10-CM | POA: Diagnosis not present

## 2015-09-24 DIAGNOSIS — J3089 Other allergic rhinitis: Secondary | ICD-10-CM | POA: Diagnosis not present

## 2015-10-05 ENCOUNTER — Other Ambulatory Visit: Payer: Self-pay | Admitting: Cardiovascular Disease

## 2015-10-05 MED FILL — ROSUVASTATIN CALCIUM 10 MG: 10 | 90 days supply | Qty: 90 | Fill #0

## 2015-10-05 NOTE — Telephone Encounter (Signed)
Rx(s) sent to pharmacy electronically.  

## 2015-10-07 DIAGNOSIS — J3081 Allergic rhinitis due to animal (cat) (dog) hair and dander: Secondary | ICD-10-CM | POA: Diagnosis not present

## 2015-10-07 DIAGNOSIS — J301 Allergic rhinitis due to pollen: Secondary | ICD-10-CM | POA: Diagnosis not present

## 2015-10-07 DIAGNOSIS — J3089 Other allergic rhinitis: Secondary | ICD-10-CM | POA: Diagnosis not present

## 2015-10-13 DIAGNOSIS — J301 Allergic rhinitis due to pollen: Secondary | ICD-10-CM | POA: Diagnosis not present

## 2015-10-13 DIAGNOSIS — J3089 Other allergic rhinitis: Secondary | ICD-10-CM | POA: Diagnosis not present

## 2015-10-13 DIAGNOSIS — J3081 Allergic rhinitis due to animal (cat) (dog) hair and dander: Secondary | ICD-10-CM | POA: Diagnosis not present

## 2015-10-22 DIAGNOSIS — J3081 Allergic rhinitis due to animal (cat) (dog) hair and dander: Secondary | ICD-10-CM | POA: Diagnosis not present

## 2015-10-22 DIAGNOSIS — J301 Allergic rhinitis due to pollen: Secondary | ICD-10-CM | POA: Diagnosis not present

## 2015-10-22 DIAGNOSIS — J3089 Other allergic rhinitis: Secondary | ICD-10-CM | POA: Diagnosis not present

## 2015-10-27 MED FILL — MONTELUKAST SOD 10 MG TAB: 10 | 90 days supply | Qty: 90 | Fill #0

## 2015-10-27 MED FILL — DESLORATADINE 5 MG TABLET: 5 | 90 days supply | Qty: 90 | Fill #0

## 2015-11-04 DIAGNOSIS — J3089 Other allergic rhinitis: Secondary | ICD-10-CM | POA: Diagnosis not present

## 2015-11-05 DIAGNOSIS — J3081 Allergic rhinitis due to animal (cat) (dog) hair and dander: Secondary | ICD-10-CM | POA: Diagnosis not present

## 2015-11-05 DIAGNOSIS — J3089 Other allergic rhinitis: Secondary | ICD-10-CM | POA: Diagnosis not present

## 2015-11-05 DIAGNOSIS — J301 Allergic rhinitis due to pollen: Secondary | ICD-10-CM | POA: Diagnosis not present

## 2015-11-17 DIAGNOSIS — J3089 Other allergic rhinitis: Secondary | ICD-10-CM | POA: Diagnosis not present

## 2015-11-17 DIAGNOSIS — J301 Allergic rhinitis due to pollen: Secondary | ICD-10-CM | POA: Diagnosis not present

## 2015-11-17 DIAGNOSIS — J3081 Allergic rhinitis due to animal (cat) (dog) hair and dander: Secondary | ICD-10-CM | POA: Diagnosis not present

## 2015-11-29 DIAGNOSIS — J3089 Other allergic rhinitis: Secondary | ICD-10-CM | POA: Diagnosis not present

## 2015-11-29 DIAGNOSIS — J3081 Allergic rhinitis due to animal (cat) (dog) hair and dander: Secondary | ICD-10-CM | POA: Diagnosis not present

## 2015-11-29 DIAGNOSIS — J301 Allergic rhinitis due to pollen: Secondary | ICD-10-CM | POA: Diagnosis not present

## 2015-12-10 DIAGNOSIS — J3089 Other allergic rhinitis: Secondary | ICD-10-CM | POA: Diagnosis not present

## 2015-12-10 DIAGNOSIS — J301 Allergic rhinitis due to pollen: Secondary | ICD-10-CM | POA: Diagnosis not present

## 2015-12-10 DIAGNOSIS — J3081 Allergic rhinitis due to animal (cat) (dog) hair and dander: Secondary | ICD-10-CM | POA: Diagnosis not present

## 2015-12-29 DIAGNOSIS — J3081 Allergic rhinitis due to animal (cat) (dog) hair and dander: Secondary | ICD-10-CM | POA: Diagnosis not present

## 2015-12-29 DIAGNOSIS — J3089 Other allergic rhinitis: Secondary | ICD-10-CM | POA: Diagnosis not present

## 2015-12-29 DIAGNOSIS — J301 Allergic rhinitis due to pollen: Secondary | ICD-10-CM | POA: Diagnosis not present

## 2016-01-05 MED FILL — ROSUVASTATIN CALCIUM 10 MG: 10 | 90 days supply | Qty: 90 | Fill #1

## 2016-01-20 DIAGNOSIS — J301 Allergic rhinitis due to pollen: Secondary | ICD-10-CM | POA: Diagnosis not present

## 2016-01-20 DIAGNOSIS — J3089 Other allergic rhinitis: Secondary | ICD-10-CM | POA: Diagnosis not present

## 2016-01-20 DIAGNOSIS — J3081 Allergic rhinitis due to animal (cat) (dog) hair and dander: Secondary | ICD-10-CM | POA: Diagnosis not present

## 2016-02-01 DIAGNOSIS — J3089 Other allergic rhinitis: Secondary | ICD-10-CM | POA: Diagnosis not present

## 2016-02-01 DIAGNOSIS — J3081 Allergic rhinitis due to animal (cat) (dog) hair and dander: Secondary | ICD-10-CM | POA: Diagnosis not present

## 2016-02-01 DIAGNOSIS — J301 Allergic rhinitis due to pollen: Secondary | ICD-10-CM | POA: Diagnosis not present

## 2016-02-01 MED FILL — MONTELUKAST SOD 10 MG TAB: 10 | 90 days supply | Qty: 90 | Fill #1

## 2016-02-01 MED FILL — DESLORATADINE 5 MG TABLET: 5 | 90 days supply | Qty: 90 | Fill #1

## 2016-02-15 DIAGNOSIS — J3081 Allergic rhinitis due to animal (cat) (dog) hair and dander: Secondary | ICD-10-CM | POA: Diagnosis not present

## 2016-02-15 DIAGNOSIS — J3089 Other allergic rhinitis: Secondary | ICD-10-CM | POA: Diagnosis not present

## 2016-02-15 DIAGNOSIS — J301 Allergic rhinitis due to pollen: Secondary | ICD-10-CM | POA: Diagnosis not present

## 2016-02-25 DIAGNOSIS — J3089 Other allergic rhinitis: Secondary | ICD-10-CM | POA: Diagnosis not present

## 2016-02-25 DIAGNOSIS — J3081 Allergic rhinitis due to animal (cat) (dog) hair and dander: Secondary | ICD-10-CM | POA: Diagnosis not present

## 2016-02-25 DIAGNOSIS — J301 Allergic rhinitis due to pollen: Secondary | ICD-10-CM | POA: Diagnosis not present

## 2016-03-01 DIAGNOSIS — J301 Allergic rhinitis due to pollen: Secondary | ICD-10-CM | POA: Diagnosis not present

## 2016-03-01 DIAGNOSIS — J3089 Other allergic rhinitis: Secondary | ICD-10-CM | POA: Diagnosis not present

## 2016-03-01 DIAGNOSIS — J3081 Allergic rhinitis due to animal (cat) (dog) hair and dander: Secondary | ICD-10-CM | POA: Diagnosis not present

## 2016-03-23 DIAGNOSIS — J3081 Allergic rhinitis due to animal (cat) (dog) hair and dander: Secondary | ICD-10-CM | POA: Diagnosis not present

## 2016-03-23 DIAGNOSIS — J301 Allergic rhinitis due to pollen: Secondary | ICD-10-CM | POA: Diagnosis not present

## 2016-03-23 DIAGNOSIS — J3089 Other allergic rhinitis: Secondary | ICD-10-CM | POA: Diagnosis not present

## 2016-04-18 DIAGNOSIS — J3081 Allergic rhinitis due to animal (cat) (dog) hair and dander: Secondary | ICD-10-CM | POA: Diagnosis not present

## 2016-04-18 DIAGNOSIS — J3089 Other allergic rhinitis: Secondary | ICD-10-CM | POA: Diagnosis not present

## 2016-04-18 DIAGNOSIS — J301 Allergic rhinitis due to pollen: Secondary | ICD-10-CM | POA: Diagnosis not present

## 2016-04-20 DIAGNOSIS — J3089 Other allergic rhinitis: Secondary | ICD-10-CM | POA: Diagnosis not present

## 2016-04-20 DIAGNOSIS — J301 Allergic rhinitis due to pollen: Secondary | ICD-10-CM | POA: Diagnosis not present

## 2016-04-20 DIAGNOSIS — J3081 Allergic rhinitis due to animal (cat) (dog) hair and dander: Secondary | ICD-10-CM | POA: Diagnosis not present

## 2016-04-20 MED FILL — MONTELUKAST SOD 10 MG TAB: 10 | 30 days supply | Qty: 30 | Fill #0

## 2016-04-20 MED FILL — DESLORATADINE 5 MG TABLET: 5 | 30 days supply | Qty: 30 | Fill #0

## 2016-04-25 DIAGNOSIS — J301 Allergic rhinitis due to pollen: Secondary | ICD-10-CM | POA: Diagnosis not present

## 2016-04-25 DIAGNOSIS — J3081 Allergic rhinitis due to animal (cat) (dog) hair and dander: Secondary | ICD-10-CM | POA: Diagnosis not present

## 2016-04-27 DIAGNOSIS — J3081 Allergic rhinitis due to animal (cat) (dog) hair and dander: Secondary | ICD-10-CM | POA: Diagnosis not present

## 2016-04-27 DIAGNOSIS — J301 Allergic rhinitis due to pollen: Secondary | ICD-10-CM | POA: Diagnosis not present

## 2016-04-27 DIAGNOSIS — J3089 Other allergic rhinitis: Secondary | ICD-10-CM | POA: Diagnosis not present

## 2016-04-27 MED FILL — ROSUVASTATIN CALCIUM 10 MG: 10 | 90 days supply | Qty: 90 | Fill #2

## 2016-05-01 ENCOUNTER — Ambulatory Visit (INDEPENDENT_AMBULATORY_CARE_PROVIDER_SITE_OTHER): Payer: 59 | Admitting: Physician Assistant

## 2016-05-01 VITALS — BP 118/76 | HR 79 | Temp 98.3°F | Resp 18 | Ht 68.0 in | Wt 234.0 lb

## 2016-05-01 DIAGNOSIS — M7751 Other enthesopathy of right foot: Secondary | ICD-10-CM

## 2016-05-01 DIAGNOSIS — M79671 Pain in right foot: Secondary | ICD-10-CM | POA: Diagnosis not present

## 2016-05-01 MED ORDER — MELOXICAM 7.5 MG PO TABS: 7.5000 mg | ORAL_TABLET | Freq: Every day | ORAL | 1 refills | Status: DC

## 2016-05-01 NOTE — Patient Instructions (Addendum)
Please perform these stretches every day at least 1-2 times a day.  Apply heat for 15 minutes at a time, 1-2 times a day.  Take Meloxicam daily. Max daose '15mg'$ /day Come back in 2-3 weeks if you are not better.   Thank you for coming in today. I hope you feel we met your needs.  Feel free to call [facility] if you have any questions or further requests.  Please consider signing up for MyChart if you do not already have it, as this is a great way to communicate with me.  Best,  Whitney McVey, PA-C    IF you received an x-ray today, you will receive an invoice from [facility]. Please contact [facility] at [phone] with questions or concerns regarding your invoice.   IF you received labwork today, you will receive an invoice from [place]. Please contact [facility] at [phone] with questions or concerns regarding your invoice.   Our billing staff will not be able to assist you with questions regarding bills from these companies.  You will be contacted with the lab results as soon as they are available. The fastest way to get your results is to activate your My Chart account. Instructions are located on the last page of this paperwork. If you have not heard from [place] regarding the results in 2 weeks, please contact this office.

## 2016-05-01 NOTE — Progress Notes (Signed)
   Jason Osborne  MRN: [id number] DOB: 07/17/1985  PCP: Lonie PeakNathan Conroy, PA-C  Subjective:  Pt is a 30 year old male who presents to clinic for right foot injury x two weeks. His pain is located on the outside of his right heel. 0/10 to 8 or 9/10.  Hurts worse with right leg in full extension and his toes pointing up.   He jumped over a little wall - did not feel pain at that time, however this is the only physical activity he can think of that could have caused his injury.  Denies swelling, bruising, weakness, numbness/tingling.   Review of Systems  Constitutional: Negative for chills and diaphoresis.  Respiratory: Negative for cough, chest tightness, shortness of breath and wheezing.   Cardiovascular: Negative for chest pain, palpitations and leg swelling.  Gastrointestinal: Negative for diarrhea, nausea and vomiting.  Musculoskeletal: Positive for arthralgias and gait problem. Negative for joint swelling and neck pain.  Neurological: Negative for dizziness, syncope, light-headedness and headaches.  Psychiatric/Behavioral: Negative for sleep disturbance. The patient is not nervous/anxious.     Patient Active Problem List   Diagnosis Date Noted  . Mild obesity 08/13/2014  . Hyperlipidemia 03/05/2013    Current Outpatient Prescriptions on File Prior to Visit  Medication Sig Dispense Refill  . Desloratadine (CLARINEX PO) Take 1 tablet by mouth daily.    . montelukast (SINGULAIR) 10 MG tablet Take 10 mg by mouth at bedtime.    . NON FORMULARY Allergy injection    . rosuvastatin (CRESTOR) 10 MG tablet TAKE 1 TABLET BY MOUTH AT BEDTIME 30 tablet 10  . fluticasone (FLONASE) 50 MCG/ACT nasal spray Place 2 sprays into both nostrils daily. (Patient not taking: Reported on 05/01/2016) 16 g 0   No current facility-administered medications on file prior to visit.     No Known Allergies   Objective:  BP 118/76 (BP Location: Right Arm, Patient Position: Sitting, Cuff Size: Small)   Pulse 79    Temp 98.3 F (36.8 C) (Oral)   Resp 18   Ht 5\' 8"  (1.727 m)   Wt 234 lb (106.1 kg)   SpO2 98%   BMI 35.58 kg/m   Physical Exam  Constitutional: He is oriented to person, place, and time and well-developed, well-nourished, and in no distress. No distress.  Cardiovascular: Normal rate, regular rhythm and normal heart sounds.   Musculoskeletal:       Feet:  Neurological: He is alert and oriented to person, place, and time. GCS score is 15.  Skin: Skin is warm and dry.  Psychiatric: Mood, memory, affect and judgment normal.  Vitals reviewed.   Assessment and Plan :  1. Tendonitis of ankle, right 2. Pain of right heel - meloxicam (MOBIC) 7.5 MG tablet; Take 1 tablet (7.5 mg total) by mouth daily.  Dispense: 30 tablet; Refill: 1 - Supportive care: Stretches demonstrated for pt. Perform stretches every day at least 1-2 times a day. Apply heat for 15 minutes at a time, 1-2 times a day. Take Meloxicam daily. Max daose 15mg /day. Come back in 2-3 weeks if not better.    Marco CollieWhitney Grayden Burley, PA-C  [facility] 05/01/2016 6:20 PM

## 2016-05-05 DIAGNOSIS — J3081 Allergic rhinitis due to animal (cat) (dog) hair and dander: Secondary | ICD-10-CM | POA: Diagnosis not present

## 2016-05-05 DIAGNOSIS — J3089 Other allergic rhinitis: Secondary | ICD-10-CM | POA: Diagnosis not present

## 2016-05-05 DIAGNOSIS — J301 Allergic rhinitis due to pollen: Secondary | ICD-10-CM | POA: Diagnosis not present

## 2016-05-11 DIAGNOSIS — J3081 Allergic rhinitis due to animal (cat) (dog) hair and dander: Secondary | ICD-10-CM | POA: Diagnosis not present

## 2016-05-11 DIAGNOSIS — J301 Allergic rhinitis due to pollen: Secondary | ICD-10-CM | POA: Diagnosis not present

## 2016-05-11 DIAGNOSIS — J3089 Other allergic rhinitis: Secondary | ICD-10-CM | POA: Diagnosis not present

## 2016-05-29 DIAGNOSIS — J3089 Other allergic rhinitis: Secondary | ICD-10-CM | POA: Diagnosis not present

## 2016-05-29 DIAGNOSIS — J301 Allergic rhinitis due to pollen: Secondary | ICD-10-CM | POA: Diagnosis not present

## 2016-05-29 DIAGNOSIS — J3081 Allergic rhinitis due to animal (cat) (dog) hair and dander: Secondary | ICD-10-CM | POA: Diagnosis not present

## 2016-05-29 MED FILL — EPINEPHRINE 0.3 MG AUTO-INJ: 0.3 | 30 days supply | Qty: 2 | Fill #0

## 2016-06-07 MED FILL — DESLORATADINE 5 MG TABLET: 5 | 30 days supply | Qty: 30 | Fill #1

## 2016-06-07 MED FILL — MONTELUKAST SOD 10 MG TAB: 10 | 30 days supply | Qty: 30 | Fill #1

## 2016-06-13 DIAGNOSIS — J301 Allergic rhinitis due to pollen: Secondary | ICD-10-CM | POA: Diagnosis not present

## 2016-06-13 DIAGNOSIS — J3089 Other allergic rhinitis: Secondary | ICD-10-CM | POA: Diagnosis not present

## 2016-06-13 DIAGNOSIS — J3081 Allergic rhinitis due to animal (cat) (dog) hair and dander: Secondary | ICD-10-CM | POA: Diagnosis not present

## 2016-07-03 DIAGNOSIS — J301 Allergic rhinitis due to pollen: Secondary | ICD-10-CM | POA: Diagnosis not present

## 2016-07-03 DIAGNOSIS — J3089 Other allergic rhinitis: Secondary | ICD-10-CM | POA: Diagnosis not present

## 2016-07-03 DIAGNOSIS — J3081 Allergic rhinitis due to animal (cat) (dog) hair and dander: Secondary | ICD-10-CM | POA: Diagnosis not present

## 2016-07-06 MED FILL — MONTELUKAST SOD 10 MG TAB: 10 | 30 days supply | Qty: 30 | Fill #2

## 2016-07-06 MED FILL — DESLORATADINE 5 MG TABLET: 5 | 30 days supply | Qty: 30 | Fill #2

## 2016-07-10 DIAGNOSIS — J301 Allergic rhinitis due to pollen: Secondary | ICD-10-CM | POA: Diagnosis not present

## 2016-07-10 DIAGNOSIS — J3089 Other allergic rhinitis: Secondary | ICD-10-CM | POA: Diagnosis not present

## 2016-07-10 DIAGNOSIS — J3081 Allergic rhinitis due to animal (cat) (dog) hair and dander: Secondary | ICD-10-CM | POA: Diagnosis not present

## 2016-07-18 ENCOUNTER — Telehealth: Payer: Self-pay | Admitting: Cardiovascular Disease

## 2016-07-18 NOTE — Telephone Encounter (Signed)
Pt states that he will be in for appt 07-20-16

## 2016-07-18 NOTE — Telephone Encounter (Signed)
Mr. Jason Osborne is calling wanting a lab order faxed to this number [phone] so that he can have the labs done before he goes out of town on tomorrow

## 2016-07-19 ENCOUNTER — Other Ambulatory Visit: Payer: Self-pay | Admitting: *Deleted

## 2016-07-19 ENCOUNTER — Other Ambulatory Visit (HOSPITAL_COMMUNITY)
Admission: AD | Admit: 2016-07-19 | Discharge: 2016-07-19 | Disposition: A | Discharge status: Home / Self Care | Payer: 59 | Source: Ambulatory Visit | Attending: Cardiovascular Disease | Admitting: Cardiovascular Disease

## 2016-07-19 DIAGNOSIS — Z79899 Other long term (current) drug therapy: Secondary | ICD-10-CM | POA: Insufficient documentation

## 2016-07-19 DIAGNOSIS — E785 Hyperlipidemia, unspecified: Secondary | ICD-10-CM | POA: Insufficient documentation

## 2016-07-19 LAB — COMPREHENSIVE METABOLIC PANEL
ALT: 70 U/L — ABNORMAL HIGH (ref 17–63)
ANION GAP: 8 (ref 5–15)
AST: 40 U/L (ref 15–41)
Albumin: 3.8 g/dL (ref 3.5–5.0)
Alkaline Phosphatase: 64 U/L (ref 38–126)
BUN: 14 mg/dL (ref 6–20)
CHLORIDE: 105 mmol/L (ref 101–111)
CO2: 27 mmol/L (ref 22–32)
Calcium: 9.1 mg/dL (ref 8.9–10.3)
Creatinine, Ser: 0.91 mg/dL (ref 0.61–1.24)
GFR calc non Af Amer: >60 mL/min (ref >60)
Glucose, Bld: 86 mg/dL (ref 65–99)
POTASSIUM: 3.9 mmol/L (ref 3.5–5.1)
SODIUM: 140 mmol/L (ref 135–145)
Total Bilirubin: 0.7 mg/dL (ref 0.3–1.2)
Total Protein: 6.5 g/dL (ref 6.5–8.1)

## 2016-07-19 LAB — TSH: TSH: 1.469 u[IU]/mL (ref 0.350–4.500)

## 2016-07-19 LAB — LIPID PANEL
CHOL/HDL RATIO: 4 ratio
CHOLESTEROL: 177 mg/dL (ref 0–200)
HDL: 44 mg/dL (ref >40)
LDL Cholesterol: 122 mg/dL — ABNORMAL HIGH (ref 0–99)
TRIGLYCERIDES: 56 mg/dL (ref <150)
VLDL: 11 mg/dL (ref 0–40)

## 2016-07-19 LAB — CBC
HCT: 44.6 % (ref 39.0–52.0)
Hemoglobin: 14.8 g/dL (ref 13.0–17.0)
MCH: 30.1 pg (ref 26.0–34.0)
MCHC: 33.2 g/dL (ref 30.0–36.0)
MCV: 90.7 fL (ref 78.0–100.0)
PLATELETS: 203 10*3/uL (ref 150–400)
RBC: 4.92 MIL/uL (ref 4.22–5.81)
RDW: 14 % (ref 11.5–15.5)
WBC: 5.9 10*3/uL (ref 4.0–10.5)

## 2016-07-20 ENCOUNTER — Telehealth: Payer: Self-pay | Admitting: *Deleted

## 2016-07-20 ENCOUNTER — Ambulatory Visit (INDEPENDENT_AMBULATORY_CARE_PROVIDER_SITE_OTHER): Payer: 59 | Admitting: Cardiovascular Disease

## 2016-07-20 ENCOUNTER — Encounter: Payer: Self-pay | Admitting: Cardiovascular Disease

## 2016-07-20 VITALS — BP 120/84 | HR 65 | Ht 68.0 in | Wt 238.4 lb

## 2016-07-20 DIAGNOSIS — Z79899 Other long term (current) drug therapy: Secondary | ICD-10-CM | POA: Diagnosis not present

## 2016-07-20 DIAGNOSIS — Z8249 Family history of ischemic heart disease and other diseases of the circulatory system: Secondary | ICD-10-CM

## 2016-07-20 DIAGNOSIS — E784 Other hyperlipidemia: Secondary | ICD-10-CM

## 2016-07-20 DIAGNOSIS — E668 Other obesity: Secondary | ICD-10-CM | POA: Diagnosis not present

## 2016-07-20 DIAGNOSIS — E785 Hyperlipidemia, unspecified: Secondary | ICD-10-CM

## 2016-07-20 DIAGNOSIS — E7849 Other hyperlipidemia: Secondary | ICD-10-CM

## 2016-07-20 MED ORDER — ROSUVASTATIN CALCIUM 20 MG PO TABS: 20.0000 mg | ORAL_TABLET | Freq: Every day | ORAL | 3 refills | Status: DC

## 2016-07-20 MED ORDER — EZETIMIBE 10 MG PO TABS: 10.0000 mg | ORAL_TABLET | Freq: Every day | ORAL | 3 refills | Status: DC

## 2016-07-20 MED FILL — EZETIMIBE 10 MG TABLET: 10 | 90 days supply | Qty: 90 | Fill #0

## 2016-07-20 MED FILL — ROSUVASTATIN CALCIUM 20 MG: 20 | 90 days supply | Qty: 90 | Fill #0

## 2016-07-20 NOTE — Patient Instructions (Signed)
Your physician has recommended you make the following change in your medication:   1.) the crestor has been increased to 20 mg.  2.) start new zetia prescription.   Your physician recommends that you return for lab work in: 6 weeks fasting.  Your physician recommends that you schedule a follow-up appointment in: 2 months.

## 2016-07-20 NOTE — Telephone Encounter (Signed)
Opened in error

## 2016-07-20 NOTE — Progress Notes (Addendum)
Patient ID: Jason Osborne, male   DOB: [birth year], 31 y.o.   MRN: [id number]     HPI: Jason Osborne is a 32 y.o. male who presents to the office today for an 34 month cardiology evaluation.  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  He tells me that at age 31 he was first diagnosed as having total cholesterols in excess of 400.  He was evaluated at [facility] and initiated therapy with crystallize vitamin C.  Also was started on what sounds like bile acid sequestrant RV.  He was started on statins at age 1.  He had been on Lipitor but developed some mild LFT elevation. More recently he has  been on lower dose Crestor as well as combination with Zetia. He has strong family history for coronary artery disease in that his father died of myocardial infarction at age 99. Multiple family members  had coronary artery disease as well as significant aortic valve stenosis.  He has had issues with occasional myalgias in the past on Lipitor and on higher dose Crestor.  Over the past year, due to Zetia not being generic at the time and the cost was significant.  He discontinued Zetia and was maintained on only low-dose Crestor.  Blood work from 07/18/2016 revealed a total cholesterol 177, triglycerides 56, HDL 44, and LDL 122.  AST was 40, and ALT was minimally increased at 70.  Bilirubin was normal, as were other electrolytes.  His hemoglobin and hematocrit were stable.  He has 3 children.  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  He has noted some mild weight gain. He presents to the office today for evaluation.   Past Medical History:  Diagnosis Date  . Hyperlipidemia     Past Surgical History:  Procedure Laterality Date  . HAND SURGERY Left   . VASECTOMY      No Known Allergies  Current Outpatient Prescriptions  Medication Sig Dispense  Refill  . Desloratadine (CLARINEX PO) Take 1 tablet by mouth daily.    . fluticasone (FLONASE) 50 MCG/ACT nasal spray Place 2 sprays into both nostrils daily.    . montelukast (SINGULAIR) 10 MG tablet Take 10 mg by mouth at bedtime.    . NON FORMULARY Allergy injection    . ezetimibe (ZETIA) 10 MG tablet Take 1 tablet (10 mg total) by mouth daily. 90 tablet 3  . rosuvastatin (CRESTOR) 20 MG tablet Take 1 tablet (20 mg total) by mouth daily. 90 tablet 3   No current facility-administered medications for this visit.     Social History   Social History  . Marital status: Married    Spouse name: N/A  . Number of children: N/A  . Years of education: N/A   Occupational History  . Not on file.   Social History Main Topics  . Smoking status: Never Smoker  . Smokeless tobacco: Never Used  . Alcohol use No  . Drug use: No  . Sexual activity: Not on file   Other Topics Concern  . Not on file   Social History Narrative  . No narrative on file    Family History  Problem Relation Age of Onset  . Hypertension Mother   . Heart attack Father 33  . Heart disease Father   . Hyperlipidemia Brother   . Hyperlipidemia Maternal Grandfather   .  Hypertension Maternal Grandfather   . Cancer - Lung Maternal Grandfather   . Hyperlipidemia Brother   . Heart disease Paternal Grandfather   . Hyperlipidemia Paternal Grandfather   . Hypertension Paternal Grandfather   . Melanoma Paternal Grandfather    Additional social history is notable that he is married. He now has 3 children, ages 36 months, 58 years old, and 31 years old. 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  ROS General: Negative; No fevers, chills, or night sweats;  HEENT: Negative; No changes in vision or hearing, sinus congestion, difficulty swallowing Pulmonary: Negative; No cough, wheezing, shortness of breath, hemoptysis Cardiovascular: Negative; No chest pain, presyncope, syncope, palpitations GI: Negative; No  nausea, vomiting, diarrhea, or abdominal pain GU: Negative; No dysuria, hematuria, or difficulty voiding Musculoskeletal: Negative; no myalgias, joint pain, or weakness Hematologic/Oncology: Negative; no easy bruising, bleeding Endocrine: Negative; no heat/cold intolerance; no diabetes Neuro: Negative; no changes in balance, headaches Skin: Negative; No rashes or skin lesions Psychiatric: Negative; No behavioral problems, depression Sleep: Negative; No snoring, daytime sleepiness, hypersomnolence, bruxism, restless legs, hypnogognic hallucinations, no cataplexy Other comprehensive 14 point system review is negative.   PE BP 120/84   Pulse 65   Ht _0  (1.727 m)   Wt 238 lb 6.4 oz (108.1 kg)   BMI 36.25 kg/m    Wt Readings from Last 3 Encounters:  07/20/16 238 lb 6.4 oz (108.1 kg)  05/01/16 234 lb (106.1 kg)  07/31/15 226 lb (102.5 kg)   Repeat blood pressure when taken by me 110/78 General: Alert, oriented, no distress.  Skin: normal turgor, no rashes HEENT: Normocephalic, atraumatic. Pupils round and reactive; sclera anicteric;no lid lag.  Nose without nasal septal hypertrophy Mouth/Parynx benign; Mallinpatti scale 2 Neck: No JVD, no carotid bruits Chest wall: Nontender to palpation Lungs: clear to ausculatation and percussion; no wheezing or rales Heart: RRR, s1 s2 normal; no S3 or S4 gallop.  No significant murmur.  No rubs thrills or heaves.  No chest wall tenderness Abdomen: soft, nontender; no hepatosplenomehaly, BS+; abdominal aorta nontender and not dilated by palpation. Back: No CVA tenderness Pulses 2+ Extremities: no clubbing cyanosis or edema, Homan's sign negative ; no muscle tenderness to palpation  Neurologic: grossly nonfocal Psychologic: normal affect and mood.  ECG (independently read by me): Normal sinus rhythm with mild sinus arrhythmia.  Normal intervals.  Non-diagnostic T wave inversion in lead 3  ECG (independently read by me, and (: Normal sinus  rhythm with mild sinus arrhythmia.  Heart rate 65 bpm.  Normal intervals.  Nondiagnostic T changes in lead 3.  October 2014 ECG: Normal sinus rhythm at 80 beats per minute  LABS: BMP Latest Ref Rng & Units 07/19/2016 08/13/2014 03/03/2013  Glucose 65 - 99 mg/dL 86 90 93  BUN 6 - 20 mg/dL _1 Creatinine 0.61 - 1.24 mg/dL 0.91 0.83 1.02  Sodium 135 - 145 mmol/L 140 141 139  Potassium 3.5 - 5.1 mmol/L 3.9 4.6 4.0  Chloride 101 - 111 mmol/L 105 103 105  CO2 22 - 32 mmol/L _2 Calcium 8.9 - 10.3 mg/dL 9.1 9.4 9.9   Hepatic Function Latest Ref Rng & Units 07/19/2016 08/13/2014 03/03/2013  Total Protein 6.5 - 8.1 g/dL 6.5 7.2 7.1  Albumin 3.5 - 5.0 g/dL 3.8 4.5 4.5  AST 15 - 41 U/L 40 37 30  ALT 17 - 63 U/L 70(H) 64(H) 58(H)  Alk Phosphatase 38 - 126 U/L 64 82 69  Total Bilirubin 0.3 - 1.2 mg/dL 0.7 1.1 1.0   CBC Latest Ref Rng & Units 07/19/2016 08/13/2014 03/03/2013  WBC 4.0 - 10.5 K/uL 5.9 5.3 5.3  Hemoglobin 13.0 - 17.0 g/dL 14.8 15.6 16.0  Hematocrit 39.0 - 52.0 % 44.6 45.0 44.8  Platelets 150 - 400 K/uL 203 234 209   Lab Results  Component Value Date   MCV 90.7 07/19/2016   MCV 87.2 08/13/2014   MCV 86.5 03/03/2013   Lab Results  Component Value Date   TSH 1.469 07/19/2016    Lipid Panel     Component Value Date/Time   CHOL 177 07/19/2016 0500   TRIG 56 07/19/2016 0500   HDL 44 07/19/2016 0500   CHOLHDL 4.0 07/19/2016 0500   VLDL 11 07/19/2016 0500   LDLCALC 122 (H) 07/19/2016 0500     RADIOLOGY: No results found.  IMPRESSION:  1. 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 Hyperlipidemia, unspecified hyperlipidemia type   3. Medication management   4. Family history of early CAD   6. Moderate obesity     ASSESSMENT AND PLAN: Mr. 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  He was demonstrated to have cholesterols in excess of 400 prior to age 12 and has been on statin therapy since  age 17.  In the past, he had some mild LFT elevation on Lipitor as well as development of myalgias to higher dose Crestor.  He had been on Zetia and Crestor combination, but recently has not been taking Zetia which he stopped before had turned generic.  I discussed with him today that Zetia is now generic and perhaps the cost is significantly lower.  I reviewed his most recent lipid studies.  I have recommending a retrial of increased Crestor at 20 mg and also will reinitiate acetamide 10 mg.  He did have upper normal LFT levels on laboratory done yesterday.  We will need to monitor LFTs closely.  Since I last saw him, PCSK9  inhibition therapy has become available, both with Repatha and Praluent.  He would be an excellent candidate for this optimal therapy particularly with his genetic history.  He will undergo repeat blood work in 6 weeks on the increased dose of Crestor and resumption of acetamide, and I will see him in 2 months for reevaluation.  Thirdly reviewed the recent data with Repatha involving 8 studies, as well as recent outcome data.  I also discussed with him recent data suggesting that subclinical atherosclerosis may actually start to occur when LDL cholesterols reached 60.  If we are unable to be very aggressive with pharmacotherapy, I will initiate the process for PCSK9 inhibition at his next office visit in 2 months.  He is moderately obese with a body mass index of 36; weight reduction and increased exercise was recommended.  Time spent: 25 minutes Troy Sine, MD, [facility]  07/20/2016 10:24 AM

## 2016-07-26 DIAGNOSIS — J301 Allergic rhinitis due to pollen: Secondary | ICD-10-CM | POA: Diagnosis not present

## 2016-07-26 DIAGNOSIS — J3089 Other allergic rhinitis: Secondary | ICD-10-CM | POA: Diagnosis not present

## 2016-07-26 DIAGNOSIS — J3081 Allergic rhinitis due to animal (cat) (dog) hair and dander: Secondary | ICD-10-CM | POA: Diagnosis not present

## 2016-07-27 DIAGNOSIS — J3081 Allergic rhinitis due to animal (cat) (dog) hair and dander: Secondary | ICD-10-CM | POA: Diagnosis not present

## 2016-07-27 DIAGNOSIS — J301 Allergic rhinitis due to pollen: Secondary | ICD-10-CM | POA: Diagnosis not present

## 2016-08-01 DIAGNOSIS — J3081 Allergic rhinitis due to animal (cat) (dog) hair and dander: Secondary | ICD-10-CM | POA: Diagnosis not present

## 2016-08-01 DIAGNOSIS — J3089 Other allergic rhinitis: Secondary | ICD-10-CM | POA: Diagnosis not present

## 2016-08-01 DIAGNOSIS — J301 Allergic rhinitis due to pollen: Secondary | ICD-10-CM | POA: Diagnosis not present

## 2016-08-07 MED FILL — DESLORATADINE 5 MG TAB: 5 | 30 days supply | Qty: 30 | Fill #3

## 2016-08-07 MED FILL — MONTELUKAST SOD 10 MG TAB: 10 | 30 days supply | Qty: 30 | Fill #3

## 2016-08-18 ENCOUNTER — Ambulatory Visit (INDEPENDENT_AMBULATORY_CARE_PROVIDER_SITE_OTHER): Payer: 59 | Admitting: Physician Assistant

## 2016-08-18 DIAGNOSIS — J0100 Acute maxillary sinusitis, unspecified: Secondary | ICD-10-CM

## 2016-08-18 MED ORDER — AMOXICILLIN-POT CLAVULANATE 875-125 MG PO TABS: 1.0000 | ORAL_TABLET | Freq: Two times a day (BID) | ORAL | 0 refills | Status: AC

## 2016-08-18 MED ORDER — PREDNISONE 20 MG PO TABS: 40.0000 mg | ORAL_TABLET | Freq: Every day | ORAL | 0 refills | Status: AC

## 2016-08-18 NOTE — Progress Notes (Signed)
MRN: [id number] DOB: [birth year]  Subjective:   Jason Osborne is a 31 y.o. male presenting for chief complaint of Nasal Congestion (since Monday) .  Reports 5 day history nasal congestion, sinus pressure, watery eyes, sneezing, ear fullness, and post nasal drainage. Had a fever of 102 for the first three days but it has come down now. Has tried sudafed, tylenol, cold/flu pack, flonase, clarinex, and singulair with no relief. Denies wheezing, shortness of breath and chest tightness, nausea, vomiting, abdominal pain and diarrhea. Has had sick contact with his children. Has history of severe seasonal allergies. No history of asthma. Patient has not had flu shot this season. Denies smoking. Denies any other aggravating or relieving factors, no other questions or concerns.  Deroy has a current medication list which includes the following prescription(s): vitamin c, desloratadine, ezetimibe, fluticasone, montelukast, NON FORMULARY, omeprazole, and rosuvastatin. Also has No Known Allergies.  Duffy  has a past medical history of Hyperlipidemia. Also  has a past surgical history that includes Hand surgery (Left) and Vasectomy.   Objective:   Vitals: BP 133/81   Pulse 86   Temp 99 F (37.2 C) (Oral)   Resp 18   Ht  (1.727 m)   Wt 233 lb (105.7 kg)   SpO2 97%   BMI 35.43 kg/m   Physical Exam  Constitutional: He is oriented to person, place, and time. He appears well-developed and well-nourished. He appears distressed (appears uncomfortable sitting on exam table).  HENT:  Head: Normocephalic and atraumatic.  Right Ear: External ear and ear canal normal. Tympanic membrane is retracted.  Left Ear: External ear and ear canal normal. Tympanic membrane is retracted.  Nose: Mucosal edema (severe bilaterally) and rhinorrhea present. Right sinus exhibits maxillary sinus tenderness. Right sinus exhibits no frontal sinus tenderness. Left sinus exhibits maxillary sinus tenderness. Left sinus  exhibits no frontal sinus tenderness.  Mouth/Throat: Uvula is midline and mucous membranes are normal. Posterior oropharyngeal erythema present.  Eyes: Right eye exhibits discharge (watery). Left eye exhibits discharge (watery). Right conjunctiva is injected ( mild). Left conjunctiva is injected ( mild).  Neck: Normal range of motion.  Pulmonary/Chest: Effort normal.  Lymphadenopathy:       Head (right side): No submental, no submandibular, no tonsillar, no preauricular, no posterior auricular and no occipital adenopathy present.       Head (left side): No submental, no submandibular, no tonsillar, no preauricular, no posterior auricular and no occipital adenopathy present.    He has no cervical adenopathy.       Right: No supraclavicular adenopathy present.       Left: No supraclavicular adenopathy present.  Neurological: He is alert and oriented to person, place, and time.  Skin: Skin is warm and dry.  Psychiatric: He has a normal mood and affect.  Vitals reviewed.  No results found for this or any previous visit (from the past 24 hour(s)).  Assessment and Plan :  1. Acute maxillary sinusitis, recurrence not specified Instructed pt that he can use OTC Afrin for severe nasal congestion for a short period, no longer than 3 days. Instructed to return to clinic if symptoms worsen, do not improve in 7-10 days, or as needed - predniSONE (DELTASONE) 20 MG tablet; Take 2 tablets (40 mg total) by mouth daily with breakfast.  Dispense: 10 tablet; Refill: 0 - amoxicillin-clavulanate (AUGMENTIN) 875-125 MG tablet; Take 1 tablet by mouth 2 (two) times daily.  Dispense: 14 tablet; Refill: 0  Benjiman Core, PA-C  [facility] 08/18/2016 5:30 PM

## 2016-08-18 NOTE — Patient Instructions (Addendum)
We will your treat your sinus infection with both a steroid course and antibiotic. Prednisone has side effects of increased heart rate, nausea, headache, and insomnia so please take with food in the morning to help decrease side effect profile. I also recommend taking augmentin with food. For the nasal congestion, you can also use OTC afrin for a short period of time. I do not recommend using it longer than 3 days because it can make symptoms worse if you use it much longer than 3 days.  Return to clinic if symptoms worsen, do not improve in 7-10 days, or as needed     Sinusitis, Adult Sinusitis is soreness and inflammation of your sinuses. Sinuses are hollow spaces in the bones around your face. They are located:  Around your eyes.  In the middle of your forehead.  Behind your nose.  In your cheekbones. Your sinuses and nasal passages are lined with a stringy fluid (mucus). Mucus normally drains out of your sinuses. When your nasal tissues get inflamed or swollen, the mucus can get trapped or blocked so air cannot flow through your sinuses. This lets bacteria, viruses, and funguses grow, and that leads to infection. Follow these instructions at home: Medicines   Take, use, or apply over-the-counter and prescription medicines only as told by your doctor. These may include nasal sprays.  If you were prescribed an antibiotic medicine, take it as told by your doctor. Do not stop taking the antibiotic even if you start to feel better. Hydrate and Humidify   Drink enough water to keep your pee (urine) clear or pale yellow.  Use a cool mist humidifier to keep the humidity level in your home above 50%.  Breathe in steam for 10-15 minutes, 3-4 times a day or as told by your doctor. You can do this in the bathroom while a hot shower is running.  Try not to spend time in cool or dry air. Rest   Rest as much as possible.  Sleep with your head raised (elevated).  Make sure to get enough  sleep each night. General instructions   Put a warm, moist washcloth on your face 3-4 times a day or as told by your doctor. This will help with discomfort.  Wash your hands often with soap and water. If there is no soap and water, use hand sanitizer.  Do not smoke. Avoid being around people who are smoking (secondhand smoke).  Keep all follow-up visits as told by your doctor. This is important. Contact a doctor if:  You have a fever.  Your symptoms get worse.  Your symptoms do not get better within 10 days. Get help right away if:  You have a very bad headache.  You cannot stop throwing up (vomiting).  You have pain or swelling around your face or eyes.  You have trouble seeing.  You feel confused.  Your neck is stiff.  You have trouble breathing. This information is not intended to replace advice given to you by your health care provider. Make sure you discuss any questions you have with your health care provider. Document Released: 10/18/2007 Document Revised: 12/26/2015 Document Reviewed: 02/24/2015 Elsevier Interactive Patient Education  2017 [employer].  IF you received an x-ray today, you will receive an invoice from [facility]. Please contact [facility] at [phone] with questions or concerns regarding your invoice.   IF you received labwork today, you will receive an invoice from [facility]. Please contact [facility] at [phone] with questions or concerns regarding  your invoice.   Our billing staff will not be able to assist you with questions regarding bills from these companies.  You will be contacted with the lab results as soon as they are available. The fastest way to get your results is to activate your My Chart account. Instructions are located on the last page of this paperwork. If you have not heard from [place] regarding the results in 2 weeks, please contact this office.

## 2016-08-25 DIAGNOSIS — J3081 Allergic rhinitis due to animal (cat) (dog) hair and dander: Secondary | ICD-10-CM | POA: Diagnosis not present

## 2016-08-25 DIAGNOSIS — J3089 Other allergic rhinitis: Secondary | ICD-10-CM | POA: Diagnosis not present

## 2016-08-25 DIAGNOSIS — J301 Allergic rhinitis due to pollen: Secondary | ICD-10-CM | POA: Diagnosis not present

## 2016-09-05 DIAGNOSIS — J3081 Allergic rhinitis due to animal (cat) (dog) hair and dander: Secondary | ICD-10-CM | POA: Diagnosis not present

## 2016-09-05 DIAGNOSIS — J301 Allergic rhinitis due to pollen: Secondary | ICD-10-CM | POA: Diagnosis not present

## 2016-09-05 DIAGNOSIS — J3089 Other allergic rhinitis: Secondary | ICD-10-CM | POA: Diagnosis not present

## 2016-09-15 MED FILL — DESLORATADINE 5 MG TAB: 5 | 30 days supply | Qty: 30 | Fill #4

## 2016-09-15 MED FILL — MONTELUKAST SOD 10 MG TAB: 10 | 30 days supply | Qty: 30 | Fill #4

## 2016-09-22 ENCOUNTER — Other Ambulatory Visit (HOSPITAL_COMMUNITY)
Admission: RE | Admit: 2016-09-22 | Discharge: 2016-09-22 | Disposition: A | Discharge status: Home / Self Care | Payer: 59 | Source: Ambulatory Visit | Attending: Cardiovascular Disease | Admitting: Cardiovascular Disease

## 2016-09-22 DIAGNOSIS — Z79899 Other long term (current) drug therapy: Secondary | ICD-10-CM | POA: Insufficient documentation

## 2016-09-22 LAB — LIPID PANEL
CHOL/HDL RATIO: 3.7 ratio
Cholesterol: 165 mg/dL (ref 0–200)
HDL: 45 mg/dL (ref >40)
LDL CALC: 104 mg/dL — AB (ref 0–99)
Triglycerides: 79 mg/dL (ref <150)
VLDL: 16 mg/dL (ref 0–40)

## 2016-09-22 LAB — HEPATIC FUNCTION PANEL
ALT: 70 U/L — ABNORMAL HIGH (ref 17–63)
AST: 42 U/L — AB (ref 15–41)
Albumin: 4 g/dL (ref 3.5–5.0)
Alkaline Phosphatase: 83 U/L (ref 38–126)
Bilirubin, Direct: 0.2 mg/dL (ref 0.1–0.5)
Indirect Bilirubin: 1.3 mg/dL — ABNORMAL HIGH (ref 0.3–0.9)
TOTAL PROTEIN: 6.7 g/dL (ref 6.5–8.1)
Total Bilirubin: 1.5 mg/dL — ABNORMAL HIGH (ref 0.3–1.2)

## 2016-09-26 ENCOUNTER — Encounter: Payer: Self-pay | Admitting: Cardiovascular Disease

## 2016-09-26 ENCOUNTER — Other Ambulatory Visit: Payer: Self-pay | Admitting: Pharmacist

## 2016-09-26 ENCOUNTER — Ambulatory Visit (INDEPENDENT_AMBULATORY_CARE_PROVIDER_SITE_OTHER): Payer: 59 | Admitting: Cardiovascular Disease

## 2016-09-26 VITALS — BP 113/73 | HR 63 | Ht 68.0 in | Wt 233.2 lb

## 2016-09-26 DIAGNOSIS — Z8249 Family history of ischemic heart disease and other diseases of the circulatory system: Secondary | ICD-10-CM

## 2016-09-26 DIAGNOSIS — E7801 Familial hypercholesterolemia: Secondary | ICD-10-CM

## 2016-09-26 DIAGNOSIS — E669 Obesity, unspecified: Secondary | ICD-10-CM | POA: Diagnosis not present

## 2016-09-26 DIAGNOSIS — E668 Other obesity: Secondary | ICD-10-CM

## 2016-09-26 DIAGNOSIS — E78019 Familial hypercholesterolemia, unspecified: Secondary | ICD-10-CM

## 2016-09-26 DIAGNOSIS — Z79899 Other long term (current) drug therapy: Secondary | ICD-10-CM | POA: Diagnosis not present

## 2016-09-26 MED ORDER — EVOLOCUMAB 140 MG/ML ~~LOC~~ SOAJ: 140.0000 mg | SUBCUTANEOUS | 11 refills | Status: DC

## 2016-09-26 NOTE — Progress Notes (Signed)
Patient ID: Jason Osborne, male   DOB: 12/26/1985, 32 y.o.   MRN: [id number]     HPI: Jason Osborne is a 31 y.o. male who presents to the office today for an 2 month cardiology evaluation.  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  He tells me that at age 36 he was first diagnosed as having total cholesterols in excess of 400.  He was evaluated at [facility] and initiated therapy with crystallize vitamin C.  Also was started on what sounds like bile acid sequestrant RV.  He was started on statins at age 20.  He had been on Lipitor but developed some mild LFT elevation. More recently he has  been on lower dose Crestor as well as combination with Zetia. He has strong family history for coronary artery disease in that his father died of myocardial infarction at age 52. Multiple family members  had coronary artery disease as well as significant aortic valve stenosis.  He has had issues with occasional myalgias in the past on Lipitor and on higher dose Crestor.  Over the past year, due to Zetia not being generic at the time and the cost was significant.  He discontinued Zetia and was maintained on only low-dose Crestor.  Blood work from 07/18/2016 revealed a total cholesterol 177, triglycerides 56, HDL 44, and LDL 122.  AST was 40, and ALT was minimally increased at 70.  Bilirubin was normal, as were other electrolytes.  His hemoglobin and hematocrit were stable.  When I last saw him, he had been off Zetia and had only been taking Crestor at 10 mg.  I recommended reinitiation of Zetia 10 mg and increase Crestor to 20 mg.  Repeat laboratory on this therapy showed improvement but continues to show an LDL cholesterol at 104 with total cholesterol 165, triglycerides 79 and HDL 45. He is not on a regular exercise program.  He has noted some mild weight gain. He presents to the office today for evaluation.   Past Medical History:  Diagnosis Date  .  Hyperlipidemia     Past Surgical History:  Procedure Laterality Date  . HAND SURGERY Left   . VASECTOMY      No Known Allergies  Current Outpatient Prescriptions  Medication Sig Dispense Refill  . Ascorbic Acid (VITAMIN C) 1000 MG tablet Take 1,000 mg by mouth daily.    . Desloratadine (CLARINEX PO) Take 1 tablet by mouth daily.    Marland Kitchen EPINEPHrine 0.3 mg/0.3 mL IJ SOAJ injection   1  . ezetimibe (ZETIA) 10 MG tablet Take 1 tablet (10 mg total) by mouth daily. 90 tablet 3  . fluticasone (FLONASE) 50 MCG/ACT nasal spray Place 2 sprays into both nostrils daily.    . montelukast (SINGULAIR) 10 MG tablet Take 10 mg by mouth at bedtime.    . NON FORMULARY Allergy injection    . omeprazole (PRILOSEC) 10 MG capsule Take 10 mg by mouth daily.    . rosuvastatin (CRESTOR) 20 MG tablet Take 1 tablet (20 mg total) by mouth daily. 90 tablet 3   No current facility-administered medications for this visit.     Social History   Social History  . Marital status: Married    Spouse name: N/A  . Number of children: N/A  . Years of education: N/A   Occupational History  . Not on file.   Social History Main Topics  . Smoking status: Never Smoker  . Smokeless tobacco: Never  Used  . Alcohol use No  . Drug use: No  . Sexual activity: Not on file   Other Topics Concern  . Not on file   Social History Narrative  . No narrative on file    Family History  Problem Relation Age of Onset  . Hypertension Mother   . Heart attack Father 79  . Heart disease Father   . Hyperlipidemia Brother   . Hyperlipidemia Maternal Grandfather   . Hypertension Maternal Grandfather   . Cancer - Lung Maternal Grandfather   . Hyperlipidemia Brother   . Heart disease Paternal Grandfather   . Hyperlipidemia Paternal Grandfather   . Hypertension Paternal Grandfather   . Melanoma Paternal Grandfather    Additional social history is notable that he is married.  He has 3 children. 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  ROS General: Negative; No fevers, chills, or night sweats;  HEENT: Negative; No changes in vision or hearing, sinus congestion, difficulty swallowing Pulmonary: Negative; No cough, wheezing, shortness of breath, hemoptysis Cardiovascular: Negative; No chest pain, presyncope, syncope, palpitations GI: Negative; No nausea, vomiting, diarrhea, or abdominal pain GU: Negative; No dysuria, hematuria, or difficulty voiding Musculoskeletal: Negative; no myalgias, joint pain, or weakness Hematologic/Oncology: Negative; no easy bruising, bleeding Endocrine: Negative; no heat/cold intolerance; no diabetes Neuro: Negative; no changes in balance, headaches Skin: Negative; No rashes or skin lesions Psychiatric: Negative; No behavioral problems, depression Sleep: Negative; No snoring, daytime sleepiness, hypersomnolence, bruxism, restless legs, hypnogognic hallucinations, no cataplexy Other comprehensive 14 point system review is negative.   PE BP 113/73   Pulse 63   Ht '5\' 8"'$  (1.727 m)   Wt 233 lb 3.2 oz (105.8 kg)   BMI 35.46 kg/m    Wt Readings from Last 3 Encounters:  09/26/16 233 lb 3.2 oz (105.8 kg)  08/18/16 233 lb (105.7 kg)  07/20/16 238 lb 6.4 oz (108.1 kg)   Repeat blood pressure when taken by me 110/78 General: Alert, oriented, no distress.  Skin: normal turgor, no rashes HEENT: Normocephalic, atraumatic. Pupils round and reactive; sclera anicteric;no lid lag.  Nose without nasal septal hypertrophy Mouth/Parynx benign; Mallinpatti scale 2 Neck: No JVD, no carotid bruits Chest wall: Nontender to palpation Lungs: clear to ausculatation and percussion; no wheezing or rales Heart: RRR, s1 s2 normal; no S3 or S4 gallop.  No significant murmur.  No rubs thrills or heaves.  No chest wall tenderness Abdomen: soft, nontender; no hepatosplenomehaly, BS+; abdominal aorta nontender and not dilated by  palpation. Back: No CVA tenderness Pulses 2+ Extremities: no clubbing cyanosis or edema, Homan's sign negative ; no muscle tenderness to palpation  Neurologic: grossly nonfocal Psychologic: normal affect and mood.  ECG (independently read by me): Normal sinus rhythm at 63 bpm.  Nondiagnostic T wave inversion in lead 3.  Normal intervals.  07/20/2016 ECG (independently read by me): Normal sinus rhythm with mild sinus arrhythmia.  Normal intervals.  Non-diagnostic T wave inversion in lead 3  ECG (independently read by me, and (: Normal sinus rhythm with mild sinus arrhythmia.  Heart rate 65 bpm.  Normal intervals.  Nondiagnostic T changes in lead 3.  October 2014 ECG: Normal sinus rhythm at 80 beats per minute  LABS: BMP Latest Ref Rng & Units 07/19/2016 08/13/2014 03/03/2013  Glucose 65 - 99 mg/dL 86 90 93  BUN 6 - 20 mg/dL '14 15 12  '$ Creatinine 0.61 - 1.24 mg/dL 0.91  0.83 1.02  Sodium 135 - 145 mmol/L 140 141 139  Potassium 3.5 - 5.1 mmol/L 3.9 4.6 4.0  Chloride 101 - 111 mmol/L 105 103 105  CO2 22 - 32 mmol/L '27 23 26  '$ Calcium 8.9 - 10.3 mg/dL 9.1 9.4 9.9   Hepatic Function Latest Ref Rng & Units 09/22/2016 07/19/2016 08/13/2014  Total Protein 6.5 - 8.1 g/dL 6.7 6.5 7.2  Albumin 3.5 - 5.0 g/dL 4.0 3.8 4.5  AST 15 - 41 U/L 42(H) 40 37  ALT 17 - 63 U/L 70(H) 70(H) 64(H)  Alk Phosphatase 38 - 126 U/L 83 64 82  Total Bilirubin 0.3 - 1.2 mg/dL 1.5(H) 0.7 1.1  Bilirubin, Direct 0.1 - 0.5 mg/dL 0.2 - -   CBC Latest Ref Rng & Units 07/19/2016 08/13/2014 03/03/2013  WBC 4.0 - 10.5 K/uL 5.9 5.3 5.3  Hemoglobin 13.0 - 17.0 g/dL 14.8 15.6 16.0  Hematocrit 39.0 - 52.0 % 44.6 45.0 44.8  Platelets 150 - 400 K/uL 203 234 209   Lab Results  Component Value Date   MCV 90.7 07/19/2016   MCV 87.2 08/13/2014   MCV 86.5 03/03/2013   Lab Results  Component Value Date   TSH 1.469 07/19/2016    Lipid Panel     Component Value Date/Time   CHOL 165 09/22/2016 0847   TRIG 79 09/22/2016 0847   HDL  45 09/22/2016 0847   CHOLHDL 3.7 09/22/2016 0847   VLDL 16 09/22/2016 0847   LDLCALC 104 (H) 09/22/2016 0847     RADIOLOGY: No results found.  IMPRESSION:  1. 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 Hyperlipidemia, unspecified hyperlipidemia type   3. Medication management   4. Family history of early CAD   64. 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  He was demonstrated to have cholesterols in excess of 400 prior to age 13 and has been on statin therapy since age 73.  In the past, he had some mild LFT elevation on Lipitor as well as development of myalgias to higher dose Crestor.  He had been on Zetia and Crestor combination, but when I last saw him, he had  been taking Zetia which he stopped before had turned generic.when I saw him, I advised him that it had become generic and that he should reinstitute this and in addition further titrated Crestor to 20 mg.  I discussed with him PCSK9 inhibition therapy particularly with his familial hyperlipidemia. Repeat laboratory on his increased medical regimen has shown a total cholesterol 165, triglycerides 79, HDL 45, and LDL is improved but still at 104.  He continues to have LFT elevation and his ALT is 70 with AST 42.  As result, I do not feel he is a candidate to further titrate his statin therapy.  Particularly with his very premature history of severe hyperlipidemia with very strong family history with his father dying in his 49s with a myocardial infarction, I have recommended the process be initiated to start Redpath.  I discussed 2 options with him and he would prefer 140 mg every 2 weeks for therapy. I discussed this in detail with our Pharm.D., whose also spent significant time with the patient.  We will initiate the application process and hopefully initiate therapy as soon as possible once approved. I also discussed the  importance of weight loss and increased exercise regimen.  His BMI is 35.46 and is  consistent with moderate obesity.  I will see him in 4 months for reevaluation.  Time spent: 25 minutes Troy Sine, MD, [facility]  09/26/2016 8:59 AM

## 2016-09-26 NOTE — Patient Instructions (Addendum)
START REPATHA - IF APPROVED THROUGH INSURANCE.  FOLLOW UP WITH [facility].- PHARMACIST   Your physician recommends that you schedule a follow-up appointment in 5 MONTHS WITH DR Tresa EndoKELLY - F/U CHOLESTEROL.     If you need a refill on your cardiac medications before your next appointment, please call your pharmacy.

## 2016-10-03 DIAGNOSIS — J3081 Allergic rhinitis due to animal (cat) (dog) hair and dander: Secondary | ICD-10-CM | POA: Diagnosis not present

## 2016-10-03 DIAGNOSIS — J301 Allergic rhinitis due to pollen: Secondary | ICD-10-CM | POA: Diagnosis not present

## 2016-10-03 DIAGNOSIS — J3089 Other allergic rhinitis: Secondary | ICD-10-CM | POA: Diagnosis not present

## 2016-10-06 DIAGNOSIS — J301 Allergic rhinitis due to pollen: Secondary | ICD-10-CM | POA: Diagnosis not present

## 2016-10-06 DIAGNOSIS — J3089 Other allergic rhinitis: Secondary | ICD-10-CM | POA: Diagnosis not present

## 2016-10-06 DIAGNOSIS — J3081 Allergic rhinitis due to animal (cat) (dog) hair and dander: Secondary | ICD-10-CM | POA: Diagnosis not present

## 2016-10-12 DIAGNOSIS — J3089 Other allergic rhinitis: Secondary | ICD-10-CM | POA: Diagnosis not present

## 2016-10-22 MED FILL — MONTELUKAST SOD 10 MG TAB: 10 | 30 days supply | Qty: 30 | Fill #5

## 2016-10-22 MED FILL — DESLORATADINE 5 MG TAB: 5 | 30 days supply | Qty: 30 | Fill #5

## 2016-10-23 MED FILL — ROSUVASTATIN CALCIUM 20 MG: 20 | 90 days supply | Qty: 90 | Fill #1

## 2016-10-23 MED FILL — EZETIMIBE 10 MG TABLET: 10 | 90 days supply | Qty: 90 | Fill #1

## 2016-10-27 ENCOUNTER — Telehealth: Payer: Self-pay | Admitting: Pharmacist

## 2016-10-27 DIAGNOSIS — J301 Allergic rhinitis due to pollen: Secondary | ICD-10-CM | POA: Diagnosis not present

## 2016-10-27 DIAGNOSIS — J3089 Other allergic rhinitis: Secondary | ICD-10-CM | POA: Diagnosis not present

## 2016-10-27 DIAGNOSIS — J3081 Allergic rhinitis due to animal (cat) (dog) hair and dander: Secondary | ICD-10-CM | POA: Diagnosis not present

## 2016-10-30 NOTE — Telephone Encounter (Signed)
Appeal letter sent to insurance for Repatha pre-approval. Patient called to pick up samples x 2 of Repatha 140mg .   Medication Samples have been provided to the patient.  Drug name: Repatha      Strength: 140mg         Qty: 2      LOT: [id number]     Exp.Date: 09/2017  Dosing instructions:  1 Pen every 14 days  The patient has been instructed regarding the correct time, dose, and frequency of taking this medication, including desired effects and most common side effects.   Sharis Keeran N Rodriguez-Guzman 6:01 PM 10/30/2016

## 2016-11-02 DIAGNOSIS — J3089 Other allergic rhinitis: Secondary | ICD-10-CM | POA: Diagnosis not present

## 2016-11-02 DIAGNOSIS — J3081 Allergic rhinitis due to animal (cat) (dog) hair and dander: Secondary | ICD-10-CM | POA: Diagnosis not present

## 2016-11-02 DIAGNOSIS — J301 Allergic rhinitis due to pollen: Secondary | ICD-10-CM | POA: Diagnosis not present

## 2016-11-21 DIAGNOSIS — J3089 Other allergic rhinitis: Secondary | ICD-10-CM | POA: Diagnosis not present

## 2016-11-21 DIAGNOSIS — J3081 Allergic rhinitis due to animal (cat) (dog) hair and dander: Secondary | ICD-10-CM | POA: Diagnosis not present

## 2016-11-21 DIAGNOSIS — J301 Allergic rhinitis due to pollen: Secondary | ICD-10-CM | POA: Diagnosis not present

## 2016-11-24 MED FILL — MONTELUKAST SOD 10 MG TAB: 10 | 30 days supply | Qty: 30 | Fill #0

## 2016-11-24 MED FILL — DESLORATADINE 5 MG TAB: 5 | 30 days supply | Qty: 30 | Fill #0

## 2016-12-04 DIAGNOSIS — J3081 Allergic rhinitis due to animal (cat) (dog) hair and dander: Secondary | ICD-10-CM | POA: Diagnosis not present

## 2016-12-04 DIAGNOSIS — J3089 Other allergic rhinitis: Secondary | ICD-10-CM | POA: Diagnosis not present

## 2016-12-04 DIAGNOSIS — J301 Allergic rhinitis due to pollen: Secondary | ICD-10-CM | POA: Diagnosis not present

## 2016-12-06 ENCOUNTER — Telehealth: Payer: Self-pay | Admitting: Pharmacist

## 2016-12-06 NOTE — Telephone Encounter (Signed)
Talked to Maralyn SagoSarah PharmD at [facility] [employer] (peer to peer review).   Noted patient on Cresto 10mg  and Zetia since 11/2010 but dose was increased to Crestor 20mg  plus Zetia 10mg  daily on March/2018.  Insurance requirement:  - Higher tolerable dose of statin (Crestor 20mg ) plus Zetia 10mg  daily for 6 months and an LDL above 100mg /dL  - Per insurance representative - if LDL remains above 100mg /dL in September 16102018; patient will qualify for Repatha 140mg  every 14 days.  Patient was informed of decision and agreed with plan.

## 2016-12-11 DIAGNOSIS — J3089 Other allergic rhinitis: Secondary | ICD-10-CM | POA: Diagnosis not present

## 2016-12-11 DIAGNOSIS — J301 Allergic rhinitis due to pollen: Secondary | ICD-10-CM | POA: Diagnosis not present

## 2016-12-11 DIAGNOSIS — J3081 Allergic rhinitis due to animal (cat) (dog) hair and dander: Secondary | ICD-10-CM | POA: Diagnosis not present

## 2016-12-26 MED FILL — DESLORATADINE 5 MG TAB: 5 | 30 days supply | Qty: 30 | Fill #1

## 2016-12-26 MED FILL — MONTELUKAST SOD 10 MG TAB: 10 | 30 days supply | Qty: 30 | Fill #1

## 2016-12-28 DIAGNOSIS — J301 Allergic rhinitis due to pollen: Secondary | ICD-10-CM | POA: Diagnosis not present

## 2016-12-28 DIAGNOSIS — J3081 Allergic rhinitis due to animal (cat) (dog) hair and dander: Secondary | ICD-10-CM | POA: Diagnosis not present

## 2016-12-28 DIAGNOSIS — J3089 Other allergic rhinitis: Secondary | ICD-10-CM | POA: Diagnosis not present

## 2017-01-22 ENCOUNTER — Other Ambulatory Visit: Payer: Self-pay | Admitting: Pharmacist

## 2017-01-22 MED ORDER — EVOLOCUMAB 140 MG/ML ~~LOC~~ SOAJ: 140.0000 mg | SUBCUTANEOUS | 11 refills | Status: DC

## 2017-01-22 MED FILL — REPATHA 140 MG/1 ML INJ: 140 | 28 days supply | Qty: 2 | Fill #0

## 2017-01-22 NOTE — Telephone Encounter (Signed)
Repatha  approved by insurance. Copay >$200. Copay card given to patient today.  Lipid panel and LFT due around 6th Repatha injection

## 2017-01-30 MED FILL — MONTELUKAST SOD 10 MG TAB: 10 | 30 days supply | Qty: 30 | Fill #2

## 2017-01-30 MED FILL — EZETIMIBE 10 MG TABLET: 10 | 90 days supply | Qty: 90 | Fill #2

## 2017-01-30 MED FILL — ROSUVASTATIN CALCIUM 20 MG: 20 | 90 days supply | Qty: 90 | Fill #2

## 2017-01-30 MED FILL — DESLORATADINE 5 MG TABLET: 5 | 30 days supply | Qty: 30 | Fill #2

## 2017-02-12 DIAGNOSIS — J301 Allergic rhinitis due to pollen: Secondary | ICD-10-CM | POA: Diagnosis not present

## 2017-02-12 DIAGNOSIS — J3081 Allergic rhinitis due to animal (cat) (dog) hair and dander: Secondary | ICD-10-CM | POA: Diagnosis not present

## 2017-02-12 DIAGNOSIS — J3089 Other allergic rhinitis: Secondary | ICD-10-CM | POA: Diagnosis not present

## 2017-02-23 DIAGNOSIS — J3089 Other allergic rhinitis: Secondary | ICD-10-CM | POA: Diagnosis not present

## 2017-02-23 DIAGNOSIS — J3081 Allergic rhinitis due to animal (cat) (dog) hair and dander: Secondary | ICD-10-CM | POA: Diagnosis not present

## 2017-02-23 DIAGNOSIS — J301 Allergic rhinitis due to pollen: Secondary | ICD-10-CM | POA: Diagnosis not present

## 2017-02-27 MED FILL — MONTELUKAST SOD 10 MG TAB: 10 | 30 days supply | Qty: 30 | Fill #0

## 2017-02-27 MED FILL — REPATHA 140 MG/1 ML INJ: 140 | 28 days supply | Qty: 2 | Fill #1

## 2017-02-27 MED FILL — DESLORATADINE 5 MG TABLET: 5 | 30 days supply | Qty: 30 | Fill #0

## 2017-03-01 DIAGNOSIS — J301 Allergic rhinitis due to pollen: Secondary | ICD-10-CM | POA: Diagnosis not present

## 2017-03-01 DIAGNOSIS — J3089 Other allergic rhinitis: Secondary | ICD-10-CM | POA: Diagnosis not present

## 2017-03-01 DIAGNOSIS — J3081 Allergic rhinitis due to animal (cat) (dog) hair and dander: Secondary | ICD-10-CM | POA: Diagnosis not present

## 2017-03-13 ENCOUNTER — Telehealth: Payer: Self-pay | Admitting: Cardiovascular Disease

## 2017-03-13 DIAGNOSIS — E7801 Familial hypercholesterolemia: Secondary | ICD-10-CM

## 2017-03-13 NOTE — Telephone Encounter (Signed)
Returned call to patient.  Patient states he has taken 6 doses of Repatha and is unsure when he is due for lab work.     Advised would route to lipid clinic to review.

## 2017-03-13 NOTE — Telephone Encounter (Signed)
LMOM; Lipid panel and LFTs orders are in epic. Patient needs to stop by office for fating blood work any day Mon-Fri between 8am and 4pm

## 2017-03-13 NOTE — Telephone Encounter (Signed)
Pt returned call to make his follow up appt started on the Medication about 2 and a half months and would like an order put in for his lab work please.   Pt asked that the office give him a call.

## 2017-03-15 ENCOUNTER — Ambulatory Visit (INDEPENDENT_AMBULATORY_CARE_PROVIDER_SITE_OTHER): Payer: 59

## 2017-03-15 ENCOUNTER — Ambulatory Visit (INDEPENDENT_AMBULATORY_CARE_PROVIDER_SITE_OTHER): Payer: 59 | Admitting: Orthopaedic Surgery

## 2017-03-15 ENCOUNTER — Telehealth: Payer: Self-pay

## 2017-03-15 ENCOUNTER — Encounter (INDEPENDENT_AMBULATORY_CARE_PROVIDER_SITE_OTHER): Payer: Self-pay | Admitting: Orthopaedic Surgery

## 2017-03-15 VITALS — BP 121/78 | HR 80 | Resp 14 | Ht 69.0 in | Wt 220.0 lb

## 2017-03-15 DIAGNOSIS — M25572 Pain in left ankle and joints of left foot: Secondary | ICD-10-CM

## 2017-03-15 DIAGNOSIS — M25872 Other specified joint disorders, left ankle and foot: Secondary | ICD-10-CM | POA: Diagnosis not present

## 2017-03-15 NOTE — Telephone Encounter (Signed)
Patient called to confirm appointment time.

## 2017-03-15 NOTE — Progress Notes (Signed)
Office Visit Note   Patient: Jason Osborne           Date of Birth: Nov 05, 1985           MRN: [id number] Visit Date: 03/15/2017              Requested by: Lonie Peakonroy, Nathan, PA-C [address] [place], [place] [id number] PCP: Lonie Peakonroy, Nathan, PA-C   Assessment & Plan: Visit Diagnoses:  1. Impingement syndrome of left ankle   2. Pain in left ankle and joints of left foot     Plan:  #1: We have given him a set of orthotics because of his significant pronation. This certainly could be leading to with lateral impingement. #2: If this is not beneficial he'll give us a call and we can proceed with an MRI scan of the left ankle.  Follow-Up Instructions: No Follow-up on file.   Orders:  Orders Placed This Encounter  Procedures  . XR Ankle Complete Left   No orders of the defined types were placed in this encounter.     Procedures: No procedures performed   Clinical Data: No additional findings.   Subjective: Chief Complaint  Patient presents with  . Left Ankle - Pain    Jason Osborne is a 31 y o that is here for Left ankle pain.    HPI  Jason Osborne was a 31 year old white male who is seen today for evaluation of his left ankle. He states he's had 15 years of occasional ankle pain without any history of injury or trauma. He denies swelling also. At times it is quite symptomatic along the lateral aspect of the ankle and he points to the talofibular area. He states he can manually popped his ankle but does know benefit to his symptoms or causes pain. He states that this past weekend he was doing some painting and was going up and down a step stool. On Tuesday he came to the point where he was unable to bear weight comfortably with pain at the area noted above. Since that time over the past 2 days he has had improvement to where it's really not painful anymore. Seen today for evaluation.  Review of Systems  Constitutional: Negative for fatigue.  HENT: Negative for hearing loss.     Respiratory: Negative for apnea, chest tightness and shortness of breath.   Cardiovascular: Negative for chest pain, palpitations and leg swelling.  Gastrointestinal: Negative for blood in stool, constipation and diarrhea.  Genitourinary: Negative for difficulty urinating.  Musculoskeletal: Negative for arthralgias, back pain, joint swelling, myalgias, neck pain and neck stiffness.  Neurological: Positive for weakness. Negative for numbness and headaches.  Hematological: Does not bruise/bleed easily.  Psychiatric/Behavioral: Positive for sleep disturbance. The patient is not nervous/anxious.      Objective: Vital Signs: BP 121/78   Pulse 80   Resp 14   Ht 5\' 9"  (1.753 m)   Wt 220 lb (99.8 kg)   BMI 32.49 kg/m   Physical Exam  Constitutional: He is oriented to person, place, and time. He appears well-developed and well-nourished.  HENT:  Head: Normocephalic and atraumatic.  Eyes: Pupils are equal, round, and reactive to light. EOM are normal.  Pulmonary/Chest: Effort normal.  Neurological: He is alert and oriented to person, place, and time.  Skin: Skin is warm and dry.  Psychiatric: He has a normal mood and affect. His behavior is normal. Judgment and thought content normal.    Ortho Exam  Exam today of left ankle does  not reveal any swelling. He with the knee in full extension can barely get to neutral. He has no tenderness to palpation. Ligamentously quite stable. I do not see any laxity on exam. No ecchymosis or erythema. No warmth.  Specialty Comments:  No specialty comments available.  Imaging: Xr Ankle Complete Left  Result Date: 03/15/2017 4 view ankle some scalloping of the talus on the AP this being lateral talus. On the lateral there's appears to be possible some flattening of the talus from its midpoint posterior. Some spurring laterally off of the talus and calcaneus. Some irregularity of the medial border of the talus.    PMFS History: Patient Active  Problem List   Diagnosis Date Noted  . Mild obesity 08/13/2014  . Hyperlipidemia 03/05/2013   Past Medical History:  Diagnosis Date  . Hyperlipidemia     Family History  Problem Relation Age of Onset  . Hypertension Mother   . Heart attack Father 38  . Heart disease Father   . Hyperlipidemia Brother   . Hyperlipidemia Maternal Grandfather   . Hypertension Maternal Grandfather   . Cancer - Lung Maternal Grandfather   . Hyperlipidemia Brother   . Heart disease Paternal Grandfather   . Hyperlipidemia Paternal Grandfather   . Hypertension Paternal Grandfather   . Melanoma Paternal Grandfather     Past Surgical History:  Procedure Laterality Date  . HAND SURGERY Left   . VASECTOMY     Social History   Occupational History  . Not on file.   Social History Main Topics  . Smoking status: Never Smoker  . Smokeless tobacco: Never Used  . Alcohol use No  . Drug use: No  . Sexual activity: Not on file

## 2017-03-20 ENCOUNTER — Other Ambulatory Visit: Payer: Self-pay | Admitting: *Deleted

## 2017-03-20 DIAGNOSIS — Z79899 Other long term (current) drug therapy: Secondary | ICD-10-CM | POA: Diagnosis not present

## 2017-03-20 DIAGNOSIS — E7801 Familial hypercholesterolemia: Secondary | ICD-10-CM | POA: Diagnosis not present

## 2017-03-20 DIAGNOSIS — J3081 Allergic rhinitis due to animal (cat) (dog) hair and dander: Secondary | ICD-10-CM | POA: Diagnosis not present

## 2017-03-20 DIAGNOSIS — J3089 Other allergic rhinitis: Secondary | ICD-10-CM | POA: Diagnosis not present

## 2017-03-20 DIAGNOSIS — J301 Allergic rhinitis due to pollen: Secondary | ICD-10-CM | POA: Diagnosis not present

## 2017-03-20 LAB — LIPID PANEL
CHOL/HDL RATIO: 1.8 ratio (ref 0.0–5.0)
CHOLESTEROL TOTAL: 90 mg/dL — AB (ref 100–199)
HDL: 51 mg/dL (ref >39)
LDL Calculated: 27 mg/dL (ref 0–99)
TRIGLYCERIDES: 61 mg/dL (ref 0–149)
VLDL Cholesterol Cal: 12 mg/dL (ref 5–40)

## 2017-03-20 LAB — HEPATIC FUNCTION PANEL
ALK PHOS: 93 IU/L (ref 39–117)
ALT: 66 IU/L — AB (ref 0–44)
AST: 40 IU/L (ref 0–40)
Albumin: 4.6 g/dL (ref 3.5–5.5)
BILIRUBIN, DIRECT: 0.34 mg/dL (ref 0.00–0.40)
Bilirubin Total: 1.1 mg/dL (ref 0.0–1.2)
Total Protein: 7.2 g/dL (ref 6.0–8.5)

## 2017-03-20 MED FILL — REPATHA 140 MG/1 ML INJ: 140 | 28 days supply | Qty: 2 | Fill #2

## 2017-04-09 MED FILL — DESLORATADINE 5 MG TABLET: 5 | 30 days supply | Qty: 30 | Fill #1

## 2017-04-09 MED FILL — MONTELUKAST SOD 10 MG TAB: 10 | 30 days supply | Qty: 30 | Fill #1

## 2017-04-18 DIAGNOSIS — J3089 Other allergic rhinitis: Secondary | ICD-10-CM | POA: Diagnosis not present

## 2017-04-18 DIAGNOSIS — J301 Allergic rhinitis due to pollen: Secondary | ICD-10-CM | POA: Diagnosis not present

## 2017-04-18 DIAGNOSIS — J3081 Allergic rhinitis due to animal (cat) (dog) hair and dander: Secondary | ICD-10-CM | POA: Diagnosis not present

## 2017-05-01 ENCOUNTER — Other Ambulatory Visit: Payer: Self-pay | Admitting: Cardiovascular Disease

## 2017-05-01 MED FILL — REPATHA 140 MG/1 ML INJ: 140 | 28 days supply | Qty: 2 | Fill #3

## 2017-05-01 MED FILL — ROSUVASTATIN CALCIUM 20 MG: 20 | 90 days supply | Qty: 90 | Fill #3

## 2017-05-01 MED FILL — EZETIMIBE 10 MG TABS: 10 | 90 days supply | Qty: 90 | Fill #0

## 2017-05-02 DIAGNOSIS — J3089 Other allergic rhinitis: Secondary | ICD-10-CM | POA: Diagnosis not present

## 2017-05-02 DIAGNOSIS — J301 Allergic rhinitis due to pollen: Secondary | ICD-10-CM | POA: Diagnosis not present

## 2017-05-02 DIAGNOSIS — J3081 Allergic rhinitis due to animal (cat) (dog) hair and dander: Secondary | ICD-10-CM | POA: Diagnosis not present

## 2017-05-09 DIAGNOSIS — J301 Allergic rhinitis due to pollen: Secondary | ICD-10-CM | POA: Diagnosis not present

## 2017-05-09 DIAGNOSIS — J3089 Other allergic rhinitis: Secondary | ICD-10-CM | POA: Diagnosis not present

## 2017-05-09 DIAGNOSIS — J3081 Allergic rhinitis due to animal (cat) (dog) hair and dander: Secondary | ICD-10-CM | POA: Diagnosis not present

## 2017-05-10 ENCOUNTER — Encounter: Payer: Self-pay | Admitting: *Deleted

## 2017-05-14 MED FILL — MONTELUKAST SOD 10 MG TAB: 10 | 30 days supply | Qty: 30 | Fill #0

## 2017-05-14 MED FILL — DESLORATADINE 5 MG TABLET: 5 | 30 days supply | Qty: 30 | Fill #0

## 2017-05-18 DIAGNOSIS — Z6835 Body mass index (BMI) 35.0-35.9, adult: Secondary | ICD-10-CM | POA: Diagnosis not present

## 2017-05-18 DIAGNOSIS — J019 Acute sinusitis, unspecified: Secondary | ICD-10-CM | POA: Diagnosis not present

## 2017-05-29 ENCOUNTER — Ambulatory Visit: Payer: 59 | Admitting: Cardiovascular Disease

## 2017-05-30 DIAGNOSIS — J3089 Other allergic rhinitis: Secondary | ICD-10-CM | POA: Diagnosis not present

## 2017-05-30 DIAGNOSIS — J3081 Allergic rhinitis due to animal (cat) (dog) hair and dander: Secondary | ICD-10-CM | POA: Diagnosis not present

## 2017-05-30 DIAGNOSIS — J301 Allergic rhinitis due to pollen: Secondary | ICD-10-CM | POA: Diagnosis not present

## 2017-06-12 MED FILL — REPATHA 140 MG/1 ML INJ: 140 | 28 days supply | Qty: 2 | Fill #4

## 2017-06-12 MED FILL — MONTELUKAST SOD 10 MG TAB: 10 | 30 days supply | Qty: 30 | Fill #1

## 2017-06-12 MED FILL — DESLORATADINE 5 MG TABLET: 5 | 30 days supply | Qty: 30 | Fill #1

## 2017-06-20 DIAGNOSIS — J3089 Other allergic rhinitis: Secondary | ICD-10-CM | POA: Diagnosis not present

## 2017-06-20 DIAGNOSIS — J3081 Allergic rhinitis due to animal (cat) (dog) hair and dander: Secondary | ICD-10-CM | POA: Diagnosis not present

## 2017-06-20 DIAGNOSIS — J301 Allergic rhinitis due to pollen: Secondary | ICD-10-CM | POA: Diagnosis not present

## 2017-07-04 DIAGNOSIS — J301 Allergic rhinitis due to pollen: Secondary | ICD-10-CM | POA: Diagnosis not present

## 2017-07-04 DIAGNOSIS — J3081 Allergic rhinitis due to animal (cat) (dog) hair and dander: Secondary | ICD-10-CM | POA: Diagnosis not present

## 2017-07-04 DIAGNOSIS — J3089 Other allergic rhinitis: Secondary | ICD-10-CM | POA: Diagnosis not present

## 2017-07-10 MED FILL — MONTELUKAST SOD 10 MG TAB: 10 | 30 days supply | Qty: 30 | Fill #2

## 2017-07-10 MED FILL — REPATHA 140 MG/1 ML INJ: 140 | 28 days supply | Qty: 2 | Fill #5

## 2017-07-10 MED FILL — DESLORATADINE 5 MG TABLET: 5 | 30 days supply | Qty: 30 | Fill #2

## 2017-07-11 DIAGNOSIS — J3089 Other allergic rhinitis: Secondary | ICD-10-CM | POA: Diagnosis not present

## 2017-07-11 DIAGNOSIS — J301 Allergic rhinitis due to pollen: Secondary | ICD-10-CM | POA: Diagnosis not present

## 2017-07-11 DIAGNOSIS — J3081 Allergic rhinitis due to animal (cat) (dog) hair and dander: Secondary | ICD-10-CM | POA: Diagnosis not present

## 2017-07-25 DIAGNOSIS — J3081 Allergic rhinitis due to animal (cat) (dog) hair and dander: Secondary | ICD-10-CM | POA: Diagnosis not present

## 2017-07-25 DIAGNOSIS — J3089 Other allergic rhinitis: Secondary | ICD-10-CM | POA: Diagnosis not present

## 2017-07-25 DIAGNOSIS — J301 Allergic rhinitis due to pollen: Secondary | ICD-10-CM | POA: Diagnosis not present

## 2017-07-30 MED FILL — EZETIMIBE 10 MG TABS: 10 | 90 days supply | Qty: 90 | Fill #1

## 2017-08-07 ENCOUNTER — Other Ambulatory Visit: Payer: Self-pay | Admitting: Cardiovascular Disease

## 2017-08-07 DIAGNOSIS — J301 Allergic rhinitis due to pollen: Secondary | ICD-10-CM | POA: Diagnosis not present

## 2017-08-07 DIAGNOSIS — J3089 Other allergic rhinitis: Secondary | ICD-10-CM | POA: Diagnosis not present

## 2017-08-07 DIAGNOSIS — J3081 Allergic rhinitis due to animal (cat) (dog) hair and dander: Secondary | ICD-10-CM | POA: Diagnosis not present

## 2017-08-07 MED FILL — MONTELUKAST SOD 10 MG TAB: 10 | 30 days supply | Qty: 30 | Fill #3

## 2017-08-07 MED FILL — DESLORATADINE 5 MG TABLET: 5 | 30 days supply | Qty: 30 | Fill #3

## 2017-08-07 MED FILL — ROSUVASTATIN CALCIUM 20 MG: 20 | 90 days supply | Qty: 90 | Fill #0

## 2017-08-07 NOTE — Telephone Encounter (Signed)
REFILL 

## 2017-08-08 DIAGNOSIS — J301 Allergic rhinitis due to pollen: Secondary | ICD-10-CM | POA: Diagnosis not present

## 2017-08-08 DIAGNOSIS — J3081 Allergic rhinitis due to animal (cat) (dog) hair and dander: Secondary | ICD-10-CM | POA: Diagnosis not present

## 2017-08-16 DIAGNOSIS — J3089 Other allergic rhinitis: Secondary | ICD-10-CM | POA: Diagnosis not present

## 2017-08-16 DIAGNOSIS — J301 Allergic rhinitis due to pollen: Secondary | ICD-10-CM | POA: Diagnosis not present

## 2017-08-16 DIAGNOSIS — J3081 Allergic rhinitis due to animal (cat) (dog) hair and dander: Secondary | ICD-10-CM | POA: Diagnosis not present

## 2017-08-16 MED FILL — REPATHA 140 MG/1 ML INJ: 140 | 28 days supply | Qty: 2 | Fill #6

## 2017-08-21 DIAGNOSIS — J301 Allergic rhinitis due to pollen: Secondary | ICD-10-CM | POA: Diagnosis not present

## 2017-08-21 DIAGNOSIS — J3089 Other allergic rhinitis: Secondary | ICD-10-CM | POA: Diagnosis not present

## 2017-08-21 DIAGNOSIS — J3081 Allergic rhinitis due to animal (cat) (dog) hair and dander: Secondary | ICD-10-CM | POA: Diagnosis not present

## 2017-08-23 DIAGNOSIS — J3081 Allergic rhinitis due to animal (cat) (dog) hair and dander: Secondary | ICD-10-CM | POA: Diagnosis not present

## 2017-08-23 DIAGNOSIS — J3089 Other allergic rhinitis: Secondary | ICD-10-CM | POA: Diagnosis not present

## 2017-08-23 DIAGNOSIS — J301 Allergic rhinitis due to pollen: Secondary | ICD-10-CM | POA: Diagnosis not present

## 2017-08-29 ENCOUNTER — Ambulatory Visit (INDEPENDENT_AMBULATORY_CARE_PROVIDER_SITE_OTHER): Payer: Self-pay | Admitting: Family Medicine

## 2017-08-29 VITALS — BP 116/88 | HR 89 | Temp 98.8°F | Resp 18 | Wt 245.8 lb

## 2017-08-29 DIAGNOSIS — J309 Allergic rhinitis, unspecified: Secondary | ICD-10-CM

## 2017-08-29 DIAGNOSIS — R0981 Nasal congestion: Secondary | ICD-10-CM

## 2017-08-29 DIAGNOSIS — J019 Acute sinusitis, unspecified: Secondary | ICD-10-CM

## 2017-08-29 MED ORDER — IPRATROPIUM BROMIDE 0.06 % NA SOLN: 2.0000 | Freq: Four times a day (QID) | NASAL | 12 refills | Status: DC

## 2017-08-29 MED ORDER — FLUTICASONE PROPIONATE 50 MCG/ACT NA SUSP: 2.0000 | Freq: Every day | NASAL | 6 refills | Status: DC

## 2017-08-29 NOTE — Progress Notes (Signed)
Jason Osborne Obey is a 32 y.o. male who complains of 24 hours of nasal congestion symptoms. He does report an extensive environmental allergen history and us currently under treatment at this time. He has attempted to relieve his symptoms with OTC medications like pseudoephedrine without real relief.    Review of Systems  Constitutional: Negative for chills, fever and malaise/fatigue.  HENT: Positive for congestion.   Eyes: Negative.   Respiratory: Positive for cough.   Cardiovascular: Negative.   Gastrointestinal: Negative.   Genitourinary: Negative.   Musculoskeletal: Negative.   Skin: Negative.   Neurological: Negative.   Endo/Heme/Allergies: Negative.   Psychiatric/Behavioral: Negative.     O: Vitals:   08/29/17 1829  BP: 116/88  Pulse: 89  Resp: 18  Temp: 98.8 F (37.1 C)  SpO2: 97%   Physical Exam  Constitutional: Vital signs are normal. He appears well-developed and well-nourished. He is active.  HENT:  Head: Normocephalic.  Right Ear: Hearing, tympanic membrane, external ear and ear canal normal.  Left Ear: Hearing, tympanic membrane, external ear and ear canal normal.  Nose: Rhinorrhea present.  Mouth/Throat: Uvula is midline and oropharynx is clear and moist. No oropharyngeal exudate.  Denies sinus pressure on palpation  Eyes: Pupils are equal, round, and reactive to light. Right eye exhibits no discharge. Left eye exhibits no discharge.  Neck: Normal range of motion. No JVD present. No tracheal deviation present.  Cardiovascular: Normal rate and regular rhythm.  Pulmonary/Chest: Effort normal and breath sounds normal. No stridor. No respiratory distress. He has no wheezes.  Abdominal: Soft. Bowel sounds are normal.  Lymphadenopathy:    He has cervical adenopathy.  Vitals reviewed.    A: 1. Allergic rhinitis, unspecified seasonality, unspecified trigger   2. Nasal congestion    P: Discussed with patient diagnosis etiology and medications provided today based  on exam and symptom presentation discussed watchful waiting technique and informed patient * will give abx if not improved. Patient verbalized understanding and agrees with POC at time of exam.  1. Allergic rhinitis, unspecified seasonality, unspecified trigger - ipratropium (ATROVENT) 0.06 % nasal spray; Place 2 sprays into both nostrils 4 (four) times daily. - fluticasone (FLONASE) 50 MCG/ACT nasal spray; Place 2 sprays into both nostrils daily.  2. Nasal congestion - ipratropium (ATROVENT) 0.06 % nasal spray; Place 2 sprays into both nostrils 4 (four) times daily.

## 2017-08-29 NOTE — Patient Instructions (Signed)

## 2017-09-04 MED ORDER — AMOXICILLIN-POT CLAVULANATE 875-125 MG PO TABS: 1.0000 | ORAL_TABLET | Freq: Two times a day (BID) | ORAL | 0 refills | Status: DC

## 2017-09-04 NOTE — Addendum Note (Signed)
Addended by: Zachery DauerGRAHAM, Tarynn Garling on: 09/04/2017 03:02 PM   Modules accepted: Orders

## 2017-09-11 DIAGNOSIS — J3089 Other allergic rhinitis: Secondary | ICD-10-CM | POA: Diagnosis not present

## 2017-09-11 DIAGNOSIS — J3081 Allergic rhinitis due to animal (cat) (dog) hair and dander: Secondary | ICD-10-CM | POA: Diagnosis not present

## 2017-09-11 DIAGNOSIS — J301 Allergic rhinitis due to pollen: Secondary | ICD-10-CM | POA: Diagnosis not present

## 2017-09-11 MED FILL — MONTELUKAST SOD 10 MG TAB: 10 | 30 days supply | Qty: 30 | Fill #4

## 2017-09-11 MED FILL — REPATHA 140 MG/1 ML INJ: 140 | 28 days supply | Qty: 2 | Fill #7

## 2017-09-11 MED FILL — DESLORATADINE 5 MG TABLET: 5 | 30 days supply | Qty: 30 | Fill #4

## 2017-09-17 DIAGNOSIS — J3089 Other allergic rhinitis: Secondary | ICD-10-CM | POA: Diagnosis not present

## 2017-10-09 DIAGNOSIS — J3081 Allergic rhinitis due to animal (cat) (dog) hair and dander: Secondary | ICD-10-CM | POA: Diagnosis not present

## 2017-10-09 DIAGNOSIS — J301 Allergic rhinitis due to pollen: Secondary | ICD-10-CM | POA: Diagnosis not present

## 2017-10-09 DIAGNOSIS — J3089 Other allergic rhinitis: Secondary | ICD-10-CM | POA: Diagnosis not present

## 2017-10-12 MED FILL — MONTELUKAST SOD 10 MG TAB: 10 | 30 days supply | Qty: 30 | Fill #5

## 2017-10-12 MED FILL — DESLORATADINE 5 MG TABLET: 5 | 30 days supply | Qty: 30 | Fill #5

## 2017-10-12 MED FILL — REPATHA 140 MG/1 ML INJ: 140 | 28 days supply | Qty: 2 | Fill #8

## 2017-10-16 DIAGNOSIS — J3089 Other allergic rhinitis: Secondary | ICD-10-CM | POA: Diagnosis not present

## 2017-10-16 DIAGNOSIS — J3081 Allergic rhinitis due to animal (cat) (dog) hair and dander: Secondary | ICD-10-CM | POA: Diagnosis not present

## 2017-10-16 DIAGNOSIS — J301 Allergic rhinitis due to pollen: Secondary | ICD-10-CM | POA: Diagnosis not present

## 2017-10-18 DIAGNOSIS — J301 Allergic rhinitis due to pollen: Secondary | ICD-10-CM | POA: Diagnosis not present

## 2017-10-18 DIAGNOSIS — J3089 Other allergic rhinitis: Secondary | ICD-10-CM | POA: Diagnosis not present

## 2017-10-18 DIAGNOSIS — J3081 Allergic rhinitis due to animal (cat) (dog) hair and dander: Secondary | ICD-10-CM | POA: Diagnosis not present

## 2017-10-24 DIAGNOSIS — J301 Allergic rhinitis due to pollen: Secondary | ICD-10-CM | POA: Diagnosis not present

## 2017-10-24 DIAGNOSIS — J3089 Other allergic rhinitis: Secondary | ICD-10-CM | POA: Diagnosis not present

## 2017-10-24 DIAGNOSIS — J3081 Allergic rhinitis due to animal (cat) (dog) hair and dander: Secondary | ICD-10-CM | POA: Diagnosis not present

## 2017-10-30 DIAGNOSIS — J3089 Other allergic rhinitis: Secondary | ICD-10-CM | POA: Diagnosis not present

## 2017-10-30 DIAGNOSIS — J3081 Allergic rhinitis due to animal (cat) (dog) hair and dander: Secondary | ICD-10-CM | POA: Diagnosis not present

## 2017-10-30 DIAGNOSIS — J301 Allergic rhinitis due to pollen: Secondary | ICD-10-CM | POA: Diagnosis not present

## 2017-11-01 DIAGNOSIS — J3089 Other allergic rhinitis: Secondary | ICD-10-CM | POA: Diagnosis not present

## 2017-11-01 DIAGNOSIS — J301 Allergic rhinitis due to pollen: Secondary | ICD-10-CM | POA: Diagnosis not present

## 2017-11-05 MED FILL — EZETIMIBE 10 MG TABLET: 10 | 90 days supply | Qty: 90 | Fill #2

## 2017-11-05 MED FILL — REPATHA 140 MG/1 ML INJ: 140 | 28 days supply | Qty: 2 | Fill #9

## 2017-11-07 ENCOUNTER — Ambulatory Visit (INDEPENDENT_AMBULATORY_CARE_PROVIDER_SITE_OTHER): Payer: Self-pay | Admitting: Family Medicine

## 2017-11-07 VITALS — BP 120/78 | HR 77 | Temp 98.5°F | Resp 16 | Wt 247.4 lb

## 2017-11-07 DIAGNOSIS — Z0000 Encounter for general adult medical examination without abnormal findings: Secondary | ICD-10-CM

## 2017-11-07 NOTE — Patient Instructions (Signed)

## 2017-11-07 NOTE — Progress Notes (Signed)
Jason Osborne is a 32 y.o. male who presents today with concerns of a need for an annual physical exam. He denies any chronic health conditions and does not have an assigned PCP at this time.  Review of Systems  Constitutional: Negative for chills, fever and malaise/fatigue.  HENT: Negative for congestion, ear discharge, ear pain, sinus pain and sore throat.   Eyes: Negative.   Respiratory: Negative for cough, sputum production and shortness of breath.   Cardiovascular: Negative.  Negative for chest pain.  Gastrointestinal: Negative for abdominal pain, diarrhea, nausea and vomiting.  Genitourinary: Negative for dysuria, frequency, hematuria and urgency.  Musculoskeletal: Negative for myalgias.  Skin: Negative.   Neurological: Negative for headaches.  Endo/Heme/Allergies: Negative.   Psychiatric/Behavioral: Negative.     O: Vitals:   11/07/17 1228  BP: 120/78  Pulse: 77  Resp: 16  Temp: 98.5 F (36.9 C)  SpO2: 97%     Physical Exam  Constitutional: He is oriented to person, place, and time. Vital signs are normal. He appears well-developed and well-nourished. He is active.  Non-toxic appearance. He does not have a sickly appearance.  HENT:  Head: Normocephalic.  Right Ear: Hearing, tympanic membrane, external ear and ear canal normal.  Left Ear: Hearing, tympanic membrane, external ear and ear canal normal.  Nose: Nose normal.  Mouth/Throat: Uvula is midline and oropharynx is clear and moist.  Neck: Normal range of motion. Neck supple.  Cardiovascular: Normal rate, regular rhythm, normal heart sounds and normal pulses.  Pulmonary/Chest: Effort normal and breath sounds normal.  Abdominal: Soft. Bowel sounds are normal.  Musculoskeletal: Normal range of motion.  Lymphadenopathy:       Head (right side): No submental and no submandibular adenopathy present.       Head (left side): No submental and no submandibular adenopathy present.    He has no cervical adenopathy.   Neurological: He is alert and oriented to person, place, and time.  Psychiatric: He has a normal mood and affect.  Vitals reviewed.  A: 1. Physical exam    P: Exam findings, diagnosis etiology and medication use and indications reviewed with patient. Follow- Up and discharge instructions provided. No emergent/urgent issues found on exam.  Patient verbalized understanding of information provided and agrees with plan of care (POC), all questions answered.  1. Physical exam WNL- completed

## 2017-11-08 DIAGNOSIS — J3089 Other allergic rhinitis: Secondary | ICD-10-CM | POA: Diagnosis not present

## 2017-11-08 DIAGNOSIS — J301 Allergic rhinitis due to pollen: Secondary | ICD-10-CM | POA: Diagnosis not present

## 2017-11-08 DIAGNOSIS — J3081 Allergic rhinitis due to animal (cat) (dog) hair and dander: Secondary | ICD-10-CM | POA: Diagnosis not present

## 2017-11-08 MED FILL — MONTELUKAST SOD 10 MG TAB: 10 | 30 days supply | Qty: 30 | Fill #0

## 2017-11-08 MED FILL — DESLORATADINE 5 MG TABLET: 5 | 30 days supply | Qty: 30 | Fill #0

## 2017-11-09 ENCOUNTER — Emergency Department (HOSPITAL_COMMUNITY)
Admission: EM | Admit: 2017-11-09 | Discharge: 2017-11-09 | Disposition: A | Discharge status: Other Acute Inpatient Hospital | Payer: 59

## 2017-11-09 DIAGNOSIS — T1582XA Foreign body in other and multiple parts of external eye, left eye, initial encounter: Secondary | ICD-10-CM | POA: Diagnosis not present

## 2017-11-21 ENCOUNTER — Other Ambulatory Visit: Payer: Self-pay

## 2017-11-21 ENCOUNTER — Telehealth: Payer: Self-pay | Admitting: Cardiovascular Disease

## 2017-11-21 ENCOUNTER — Other Ambulatory Visit: Payer: Self-pay | Admitting: Cardiovascular Disease

## 2017-11-21 DIAGNOSIS — J3081 Allergic rhinitis due to animal (cat) (dog) hair and dander: Secondary | ICD-10-CM | POA: Diagnosis not present

## 2017-11-21 DIAGNOSIS — E668 Other obesity: Secondary | ICD-10-CM

## 2017-11-21 DIAGNOSIS — J301 Allergic rhinitis due to pollen: Secondary | ICD-10-CM | POA: Diagnosis not present

## 2017-11-21 DIAGNOSIS — E669 Obesity, unspecified: Secondary | ICD-10-CM

## 2017-11-21 DIAGNOSIS — Z8249 Family history of ischemic heart disease and other diseases of the circulatory system: Secondary | ICD-10-CM

## 2017-11-21 DIAGNOSIS — E78019 Familial hypercholesterolemia, unspecified: Secondary | ICD-10-CM

## 2017-11-21 DIAGNOSIS — Z1329 Encounter for screening for other suspected endocrine disorder: Secondary | ICD-10-CM

## 2017-11-21 DIAGNOSIS — E7801 Familial hypercholesterolemia: Secondary | ICD-10-CM

## 2017-11-21 DIAGNOSIS — Z79899 Other long term (current) drug therapy: Secondary | ICD-10-CM

## 2017-11-21 DIAGNOSIS — J3089 Other allergic rhinitis: Secondary | ICD-10-CM | POA: Diagnosis not present

## 2017-11-21 MED ORDER — ROSUVASTATIN CALCIUM 20 MG PO TABS: 20.0000 mg | ORAL_TABLET | Freq: Every day | ORAL | 0 refills | Status: DC

## 2017-11-21 MED FILL — ROSUVASTATIN CALCIUM 20 MG: 20 | 90 days supply | Qty: 90 | Fill #0

## 2017-11-21 NOTE — Telephone Encounter (Signed)
Called and advised patient lab orders were placed to come here fasting before appointment in October.  Patient verbalized understanding had no questions or concerns.

## 2017-11-21 NOTE — Progress Notes (Signed)
Lip  

## 2017-11-21 NOTE — Telephone Encounter (Signed)
New Message   Pt wants to know if he needs labs prior to appt. Orders in epic are too old

## 2017-11-21 NOTE — Addendum Note (Signed)
Addended by: Darene LamerHOMPSON, Dazhane Villagomez T on: 11/21/2017 10:56 AM   Modules accepted: Orders

## 2017-11-27 DIAGNOSIS — J3089 Other allergic rhinitis: Secondary | ICD-10-CM | POA: Diagnosis not present

## 2017-11-27 DIAGNOSIS — J301 Allergic rhinitis due to pollen: Secondary | ICD-10-CM | POA: Diagnosis not present

## 2017-11-27 DIAGNOSIS — J3081 Allergic rhinitis due to animal (cat) (dog) hair and dander: Secondary | ICD-10-CM | POA: Diagnosis not present

## 2017-12-05 DIAGNOSIS — J301 Allergic rhinitis due to pollen: Secondary | ICD-10-CM | POA: Diagnosis not present

## 2017-12-05 DIAGNOSIS — J3089 Other allergic rhinitis: Secondary | ICD-10-CM | POA: Diagnosis not present

## 2017-12-05 DIAGNOSIS — J3081 Allergic rhinitis due to animal (cat) (dog) hair and dander: Secondary | ICD-10-CM | POA: Diagnosis not present

## 2017-12-11 DIAGNOSIS — J301 Allergic rhinitis due to pollen: Secondary | ICD-10-CM | POA: Diagnosis not present

## 2017-12-11 DIAGNOSIS — J3089 Other allergic rhinitis: Secondary | ICD-10-CM | POA: Diagnosis not present

## 2017-12-11 DIAGNOSIS — J3081 Allergic rhinitis due to animal (cat) (dog) hair and dander: Secondary | ICD-10-CM | POA: Diagnosis not present

## 2017-12-11 MED FILL — REPATHA 140 MG/1 ML INJ: 140 | 28 days supply | Qty: 2 | Fill #10

## 2017-12-11 MED FILL — DESLORATADINE 5 MG TABLET: 5 | 30 days supply | Qty: 30 | Fill #1

## 2017-12-11 MED FILL — MONTELUKAST SOD 10 MG TAB: 10 | 30 days supply | Qty: 30 | Fill #1

## 2017-12-19 DIAGNOSIS — J3089 Other allergic rhinitis: Secondary | ICD-10-CM | POA: Diagnosis not present

## 2017-12-19 DIAGNOSIS — J3081 Allergic rhinitis due to animal (cat) (dog) hair and dander: Secondary | ICD-10-CM | POA: Diagnosis not present

## 2017-12-19 DIAGNOSIS — J301 Allergic rhinitis due to pollen: Secondary | ICD-10-CM | POA: Diagnosis not present

## 2018-01-02 DIAGNOSIS — J301 Allergic rhinitis due to pollen: Secondary | ICD-10-CM | POA: Diagnosis not present

## 2018-01-02 DIAGNOSIS — J3089 Other allergic rhinitis: Secondary | ICD-10-CM | POA: Diagnosis not present

## 2018-01-02 DIAGNOSIS — J3081 Allergic rhinitis due to animal (cat) (dog) hair and dander: Secondary | ICD-10-CM | POA: Diagnosis not present

## 2018-01-08 ENCOUNTER — Other Ambulatory Visit: Payer: Self-pay | Admitting: Cardiovascular Disease

## 2018-01-08 MED FILL — MONTELUKAST SOD 10 MG TAB: 10 | 30 days supply | Qty: 30 | Fill #2

## 2018-01-08 MED FILL — DESLORATADINE 5 MG TAB: 5 | 30 days supply | Qty: 30 | Fill #2

## 2018-01-08 NOTE — Telephone Encounter (Signed)
Rx sent to pharmacy   

## 2018-01-23 ENCOUNTER — Other Ambulatory Visit: Payer: Self-pay | Admitting: Pharmacist

## 2018-01-23 MED ORDER — EVOLOCUMAB 140 MG/ML ~~LOC~~ SOAJ: 140.0000 mg | SUBCUTANEOUS | 11 refills | Status: DC

## 2018-01-23 MED FILL — REPATHA 140 MG/1 ML INJ: 140 | 28 days supply | Qty: 2 | Fill #0

## 2018-01-23 NOTE — Telephone Encounter (Signed)
PA for Repatha approved until 9/202. Rx sent to prefer pharmacy

## 2018-02-06 DIAGNOSIS — J3089 Other allergic rhinitis: Secondary | ICD-10-CM | POA: Diagnosis not present

## 2018-02-06 DIAGNOSIS — J301 Allergic rhinitis due to pollen: Secondary | ICD-10-CM | POA: Diagnosis not present

## 2018-02-06 DIAGNOSIS — J3081 Allergic rhinitis due to animal (cat) (dog) hair and dander: Secondary | ICD-10-CM | POA: Diagnosis not present

## 2018-02-06 DIAGNOSIS — E7801 Familial hypercholesterolemia: Secondary | ICD-10-CM | POA: Diagnosis not present

## 2018-02-06 DIAGNOSIS — E668 Other obesity: Secondary | ICD-10-CM | POA: Diagnosis not present

## 2018-02-06 DIAGNOSIS — Z8249 Family history of ischemic heart disease and other diseases of the circulatory system: Secondary | ICD-10-CM | POA: Diagnosis not present

## 2018-02-06 DIAGNOSIS — Z79899 Other long term (current) drug therapy: Secondary | ICD-10-CM | POA: Diagnosis not present

## 2018-02-06 DIAGNOSIS — Z1329 Encounter for screening for other suspected endocrine disorder: Secondary | ICD-10-CM | POA: Diagnosis not present

## 2018-02-07 LAB — LIPID PANEL
CHOL/HDL RATIO: 1.9 ratio (ref 0.0–5.0)
Cholesterol, Total: 89 mg/dL — ABNORMAL LOW (ref 100–199)
HDL: 47 mg/dL (ref >39)
LDL CALC: 30 mg/dL (ref 0–99)
TRIGLYCERIDES: 58 mg/dL (ref 0–149)
VLDL Cholesterol Cal: 12 mg/dL (ref 5–40)

## 2018-02-07 LAB — CBC
HEMOGLOBIN: 16 g/dL (ref 13.0–17.7)
Hematocrit: 45.8 % (ref 37.5–51.0)
MCH: 31 pg (ref 26.6–33.0)
MCHC: 34.9 g/dL (ref 31.5–35.7)
MCV: 89 fL (ref 79–97)
PLATELETS: 237 10*3/uL (ref 150–450)
RBC: 5.16 x10E6/uL (ref 4.14–5.80)
RDW: 13.5 % (ref 12.3–15.4)
WBC: 5.3 10*3/uL (ref 3.4–10.8)

## 2018-02-07 LAB — COMPREHENSIVE METABOLIC PANEL
A/G RATIO: 1.9 (ref 1.2–2.2)
ALBUMIN: 4.6 g/dL (ref 3.5–5.5)
ALT: 76 IU/L — ABNORMAL HIGH (ref 0–44)
AST: 43 IU/L — AB (ref 0–40)
Alkaline Phosphatase: 105 IU/L (ref 39–117)
BUN / CREAT RATIO: 14 (ref 9–20)
BUN: 14 mg/dL (ref 6–20)
Bilirubin Total: 1 mg/dL (ref 0.0–1.2)
CALCIUM: 9.6 mg/dL (ref 8.7–10.2)
CO2: 24 mmol/L (ref 20–29)
CREATININE: 1.03 mg/dL (ref 0.76–1.27)
Chloride: 102 mmol/L (ref 96–106)
GFR, EST AFRICAN AMERICAN: 111 mL/min/{1.73_m2} (ref >59)
GFR, EST NON AFRICAN AMERICAN: 96 mL/min/{1.73_m2} (ref >59)
GLOBULIN, TOTAL: 2.4 g/dL (ref 1.5–4.5)
Glucose: 95 mg/dL (ref 65–99)
POTASSIUM: 4.8 mmol/L (ref 3.5–5.2)
Sodium: 141 mmol/L (ref 134–144)
Total Protein: 7 g/dL (ref 6.0–8.5)

## 2018-02-07 LAB — TSH: TSH: 1.48 u[IU]/mL (ref 0.450–4.500)

## 2018-02-07 MED FILL — MONTELUKAST SOD 10 MG TAB: 10 | 30 days supply | Qty: 30 | Fill #3

## 2018-02-07 MED FILL — DESLORATADINE 5 MG TAB: 5 | 30 days supply | Qty: 30 | Fill #3

## 2018-02-07 MED FILL — EZETIMIBE 10 MG TABLET: 10 | 90 days supply | Qty: 90 | Fill #0

## 2018-02-14 ENCOUNTER — Other Ambulatory Visit: Payer: Self-pay | Admitting: *Deleted

## 2018-02-14 DIAGNOSIS — R945 Abnormal results of liver function studies: Secondary | ICD-10-CM

## 2018-02-14 DIAGNOSIS — Z79899 Other long term (current) drug therapy: Secondary | ICD-10-CM

## 2018-02-14 DIAGNOSIS — R7989 Other specified abnormal findings of blood chemistry: Secondary | ICD-10-CM

## 2018-02-14 MED ORDER — ROSUVASTATIN CALCIUM 20 MG PO TABS: 10.0000 mg | ORAL_TABLET | Freq: Every day | ORAL | 0 refills | Status: DC

## 2018-02-18 DIAGNOSIS — J301 Allergic rhinitis due to pollen: Secondary | ICD-10-CM | POA: Diagnosis not present

## 2018-02-18 DIAGNOSIS — J3081 Allergic rhinitis due to animal (cat) (dog) hair and dander: Secondary | ICD-10-CM | POA: Diagnosis not present

## 2018-02-18 DIAGNOSIS — J3089 Other allergic rhinitis: Secondary | ICD-10-CM | POA: Diagnosis not present

## 2018-02-21 DIAGNOSIS — J3081 Allergic rhinitis due to animal (cat) (dog) hair and dander: Secondary | ICD-10-CM | POA: Diagnosis not present

## 2018-02-21 DIAGNOSIS — J3089 Other allergic rhinitis: Secondary | ICD-10-CM | POA: Diagnosis not present

## 2018-02-21 DIAGNOSIS — J301 Allergic rhinitis due to pollen: Secondary | ICD-10-CM | POA: Diagnosis not present

## 2018-02-21 MED FILL — REPATHA 140 MG/1 ML INJ: 140 | 28 days supply | Qty: 2 | Fill #1

## 2018-02-25 ENCOUNTER — Encounter: Payer: Self-pay | Admitting: Cardiovascular Disease

## 2018-02-25 ENCOUNTER — Ambulatory Visit: Payer: 59 | Admitting: Cardiovascular Disease

## 2018-02-25 VITALS — BP 116/83 | HR 61 | Ht 68.0 in | Wt 237.4 lb

## 2018-02-25 DIAGNOSIS — E7801 Familial hypercholesterolemia: Secondary | ICD-10-CM | POA: Diagnosis not present

## 2018-02-25 DIAGNOSIS — E6609 Other obesity due to excess calories: Secondary | ICD-10-CM

## 2018-02-25 DIAGNOSIS — Z6836 Body mass index (BMI) 36.0-36.9, adult: Secondary | ICD-10-CM

## 2018-02-25 DIAGNOSIS — Z79899 Other long term (current) drug therapy: Secondary | ICD-10-CM | POA: Diagnosis not present

## 2018-02-25 DIAGNOSIS — Z8249 Family history of ischemic heart disease and other diseases of the circulatory system: Secondary | ICD-10-CM

## 2018-02-25 DIAGNOSIS — R7989 Other specified abnormal findings of blood chemistry: Secondary | ICD-10-CM

## 2018-02-25 DIAGNOSIS — R945 Abnormal results of liver function studies: Secondary | ICD-10-CM

## 2018-02-25 NOTE — Progress Notes (Signed)
Patient ID: Jason Osborne, male   DOB: June 06, 1985, 32 y.o.   MRN: [id number]     HPI: Jason Osborne is a 32 y.o. male who presents to the office today for an 60 month cardiology evaluation.  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  He tells me that at age 22 he was first diagnosed as having total cholesterols in excess of 400.  He was evaluated at [facility] and initiated therapy with crystallize vitamin C.  Also was started on what sounds like bile acid sequestrant RV.  He was started on statins at age 65.  He had been on Lipitor but developed some mild LFT elevation. More recently he has  been on lower dose Crestor as well as combination with Zetia. He has strong family history for coronary artery disease in that his father died of myocardial infarction at age 93. Multiple family members  had coronary artery disease as well as significant aortic valve stenosis.  He has had issues with occasional myalgias in the past on Lipitor and on higher dose Crestor.  Over the past year, due to Zetia not being generic at the time and the cost was significant.  He discontinued Zetia and was maintained on only low-dose Crestor.  Blood work from 07/18/2016 revealed a total cholesterol 177, triglycerides 56, HDL 44, and LDL 122.  AST was 40, and ALT was minimally increased at 70.  Bilirubin was normal, as were other electrolytes.  His hemoglobin and hematocrit were stable.  When I saw him in March 2018, he had been off Zetia and had only been taking Crestor at 10 mg.  I recommended reinitiation of Zetia 10 mg and increase Crestor to 20 mg.  Repeat laboratory on this therapy showed improvement but continues to show an LDL cholesterol at 104 with total cholesterol 165, triglycerides 79 and HDL 45.    When I saw him in follow-up in May 2018 he was not a candidate for further titration of Crestor due to LFT elevation.  I initiated the process for Repatha.  He has been on  Repatha now for over a year with excellent LDL reduction.  Most recent laboratory 2 weeks ago has shown a total cholesterol at 89, triglycerides 58, HDL 47, and LDL cholesterol at 30.  His LFTs remain mildly increased with an AST of 43 and ALT of 76.  He was contacted by telephone and his Crestor was reduced from 20 mg to 10 mg.  Over the past year, he has gained weight.  His peak weight had increased to 242.  He does karate several days per week and most recently he admits to an 11 pound weight loss.  He denies any chest pain palpitations PND orthopnea.  He presents for evaluation   Past Medical History:  Diagnosis Date  . Hyperlipidemia     Past Surgical History:  Procedure Laterality Date  . HAND SURGERY Left   . VASECTOMY      No Known Allergies  Current Outpatient Medications  Medication Sig Dispense Refill  . Desloratadine (CLARINEX PO) Take 1 tablet by mouth daily.    Marland Kitchen EPINEPHrine 0.3 mg/0.3 mL IJ SOAJ injection   1  . Evolocumab (REPATHA SURECLICK) 096 MG/ML SOAJ Inject 140 mg into the skin every 14 (fourteen) days. 2 pen 11  . ezetimibe (ZETIA) 10 MG tablet Take 1 tablet (10 mg total) by mouth daily. Need appointment, please contact office. 90 tablet 0  . fluticasone (  FLONASE) 50 MCG/ACT nasal spray Place 2 sprays into both nostrils daily. 16 g 6  . montelukast (SINGULAIR) 10 MG tablet Take 10 mg by mouth at bedtime.    . NON FORMULARY Allergy injection    . omeprazole (PRILOSEC) 10 MG capsule Take 10 mg by mouth daily.    . rosuvastatin (CRESTOR) 20 MG tablet Take 0.5 tablets (10 mg total) by mouth daily. NEED OV. 45 tablet 0   No current facility-administered medications for this visit.     Social History   Socioeconomic History  . Marital status: Married    Spouse name: Not on file  . Number of children: Not on file  . Years of education: Not on file  . Highest education level: Not on file  Occupational History  . Not on file  Social Needs  . Financial resource  strain: Not on file  . Food insecurity:    Worry: Not on file    Inability: Not on file  . Transportation needs:    Medical: Not on file    Non-medical: Not on file  Tobacco Use  . Smoking status: Never Smoker  . Smokeless tobacco: Never Used  Substance and Sexual Activity  . Alcohol use: No  . Drug use: No  . Sexual activity: Not on file  Lifestyle  . Physical activity:    Days per week: Not on file    Minutes per session: Not on file  . Stress: Not on file  Relationships  . Social connections:    Talks on phone: Not on file    Gets together: Not on file    Attends religious service: Not on file    Active member of club or organization: Not on file    Attends meetings of clubs or organizations: Not on file    Relationship status: Not on file  . Intimate partner violence:    Fear of current or ex partner: Not on file    Emotionally abused: Not on file    Physically abused: Not on file    Forced sexual activity: Not on file  Other Topics Concern  . Not on file  Social History Narrative  . Not on file    Family History  Problem Relation Age of Onset  . Hypertension Mother   . Heart attack Father 56  . Heart disease Father   . Hyperlipidemia Brother   . Hyperlipidemia Maternal Grandfather   . Hypertension Maternal Grandfather   . Cancer - Lung Maternal Grandfather   . Hyperlipidemia Brother   . Heart disease Paternal Grandfather   . Hyperlipidemia Paternal Grandfather   . Hypertension Paternal Grandfather   . Melanoma Paternal Grandfather    Additional social history is notable that he is married.  He has 3 children. 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  ROS General: Negative; No fevers, chills, or night sweats;  HEENT: Negative; No changes in vision or hearing, sinus congestion, difficulty swallowing Pulmonary: Negative; No cough, wheezing, shortness of breath, hemoptysis Cardiovascular:  Negative; No chest pain, presyncope, syncope, palpitations GI: Negative; No nausea, vomiting, diarrhea, or abdominal pain GU: Negative; No dysuria, hematuria, or difficulty voiding Musculoskeletal: Negative; no myalgias, joint pain, or weakness Hematologic/Oncology: Negative; no easy bruising, bleeding Endocrine: Negative; no heat/cold intolerance; no diabetes Neuro: Negative; no changes in balance, headaches Skin: Negative; No rashes or skin lesions Psychiatric: Negative; No behavioral problems, depression Sleep: Negative; No snoring,  daytime sleepiness, hypersomnolence, bruxism, restless legs, hypnogognic hallucinations, no cataplexy Other comprehensive 14 point system review is negative.   PE BP 116/83   Pulse 61   Ht '5\' 8"'$  (1.727 m)   Wt 237 lb 6.4 oz (107.7 kg)   BMI 36.10 kg/m    Repeat blood pressure by me was 110/78  Wt Readings from Last 3 Encounters:  02/25/18 237 lb 6.4 oz (107.7 kg)  11/07/17 247 lb 6.4 oz (112.2 kg)  08/29/17 245 lb 12.8 oz (111.5 kg)   General: Alert, oriented, no distress.  Skin: normal turgor, no rashes, warm and dry HEENT: Normocephalic, atraumatic. Pupils equal round and reactive to light; sclera anicteric; extraocular muscles intact;  Nose without nasal septal hypertrophy Mouth/Parynx benign; Mallinpatti scale Neck: No JVD, no carotid bruits; normal carotid upstroke Lungs: clear to ausculatation and percussion; no wheezing or rales Chest wall: without tenderness to palpitation Heart: PMI not displaced, RRR, s1 s2 normal, 1/6 systolic murmur, no diastolic murmur, no rubs, gallops, thrills, or heaves Abdomen: Mild central adiposity soft, nontender; no hepatosplenomehaly, BS+; abdominal aorta nontender and not dilated by palpation. Back: no CVA tenderness Pulses 2+ Musculoskeletal: full range of motion, normal strength, no joint deformities Extremities: no clubbing cyanosis or edema, Homan's sign negative  Neurologic: grossly nonfocal;  Cranial nerves grossly wnl Psychologic: Normal mood and affect  ECG (independently read by me): Normal sinus rhythm at 61 bpm.  No ectopy.  Normal intervals.  Nondiagnostic T change in lead III.  Sep 26, 2016 ECG (independently read by me): Normal sinus rhythm at 63 bpm.  Nondiagnostic T wave inversion in lead 3.  Normal intervals.  07/20/2016 ECG (independently read by me): Normal sinus rhythm with mild sinus arrhythmia.  Normal intervals.  Non-diagnostic T wave inversion in lead 3  ECG (independently read by me, and (: Normal sinus rhythm with mild sinus arrhythmia.  Heart rate 65 bpm.  Normal intervals.  Nondiagnostic T changes in lead 3.  October 2014 ECG: Normal sinus rhythm at 80 beats per minute  LABS: BMP Latest Ref Rng & Units 02/06/2018 07/19/2016 08/13/2014  Glucose 65 - 99 mg/dL 95 86 90  BUN 6 - 20 mg/dL '14 14 15  '$ Creatinine 0.76 - 1.27 mg/dL 1.03 0.91 0.83  BUN/Creat Ratio 9 - 20 14 - -  Sodium 134 - 144 mmol/L 141 140 141  Potassium 3.5 - 5.2 mmol/L 4.8 3.9 4.6  Chloride 96 - 106 mmol/L 102 105 103  CO2 20 - 29 mmol/L '24 27 23  '$ Calcium 8.7 - 10.2 mg/dL 9.6 9.1 9.4   Hepatic Function Latest Ref Rng & Units 02/06/2018 03/20/2017 09/22/2016  Total Protein 6.0 - 8.5 g/dL 7.0 7.2 6.7  Albumin 3.5 - 5.5 g/dL 4.6 4.6 4.0  AST 0 - 40 IU/L 43(H) 40 42(H)  ALT 0 - 44 IU/L 76(H) 66(H) 70(H)  Alk Phosphatase 39 - 117 IU/L 105 93 83  Total Bilirubin 0.0 - 1.2 mg/dL 1.0 1.1 1.5(H)  Bilirubin, Direct 0.00 - 0.40 mg/dL - 0.34 0.2   CBC Latest Ref Rng & Units 02/06/2018 07/19/2016 08/13/2014  WBC 3.4 - 10.8 x10E3/uL 5.3 5.9 5.3  Hemoglobin 13.0 - 17.7 g/dL 16.0 14.8 15.6  Hematocrit 37.5 - 51.0 % 45.8 44.6 45.0  Platelets 150 - 450 x10E3/uL 237 203 234   Lab Results  Component Value Date   MCV 89 02/06/2018   MCV 90.7 07/19/2016   MCV 87.2 08/13/2014   Lab Results  Component Value Date  TSH 1.480 02/06/2018    Lipid Panel     Component Value Date/Time   CHOL 89 (L)  02/06/2018 0813   TRIG 58 02/06/2018 0813   HDL 47 02/06/2018 0813   CHOLHDL 1.9 02/06/2018 0813   CHOLHDL 3.7 09/22/2016 0847   VLDL 16 09/22/2016 0847   LDLCALC 30 02/06/2018 0813     RADIOLOGY: No results found.  IMPRESSION:  1. 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 Hyperlipidemia, unspecified hyperlipidemia type   3. Medication management   4. Family history of early CAD   21. 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  He was demonstrated to have cholesterols in excess of 400 prior to age 72 and has been on statin therapy since age 77.  In the past, he had some mild LFT elevation on Lipitor as well as development of myalgias to higher dose Crestor.  In addition, he has developed mild LFT elevation.  Over the past year, he has been on Zetia 10 mg and Crestor 20 mg and Repatha was implemented to his medical regimen.  He has demonstrated a dramatic benefit from initiation of Repatha and initial follow-up blood work last year had shown an LDL cholesterol dropping down to 27.  His most recent lipid studies remain excellent with a total cholesterol of 89, LDL cholesterol 30.  Triglycerides are 58 and HDL 47.  His LFTs remain mildly elevated with an AST of 43 and ALT of 76.  He was notified to reduce his Crestor from 20 mg down to 10 mg.  He has been taking this for less than 1 week at the reduced dose.  He is moderately obese with a BMI today of 36.1.  He states that his peak weight had increased to 243 and his weight at home on a scale today was 237.  We discussed the importance of continued weight loss and exercising at least 5 days/week for minimum of 30 minutes.  I have set a weight goal for him to at least to try to get to under 200 pounds.  In 6 weeks, I will repeat his lipid panel and LFT studies with his reduce Crestor and make certain his LFTs have remained stable.   His ECG is stable.  He is not having any chest pain.  I do not hear any bruits on physical exam.  I will  contacted regarding his follow-up laboratory and will see him in the office in 1 year for reevaluation.  Time spent: 25 minutes Troy Sine, MD, [facility]  02/25/2018 8:33 AM

## 2018-02-25 NOTE — Patient Instructions (Signed)
Medication Instructions:  Your physician recommends that you continue on your current medications as directed. Please refer to the Current Medication list given to you today. If you need a refill on your cardiac medications before your next appointment, please call your pharmacy.   Lab work: Please return for FASTING labs in 6 weeks (Lipid, hepatic)  Our in office lab hours are Monday-Friday 8:00-4:00, closed for lunch 12:45-1:45 pm.  No appointment needed.  If you have labs (blood work) drawn today and your tests are completely normal, you will receive your results only by: Marland Kitchen MyChart Message (if you have MyChart) OR . A paper copy in the mail If you have any lab test that is abnormal or we need to change your treatment, we will call you to review the results.  Follow-Up: At [facility], you and your health needs are our priority.  As part of our continuing mission to provide you with exceptional heart care, we have created designated Provider Care Teams.  These Care Teams include your primary Cardiologist (physician) and Advanced Practice Providers (APPs -  Physician Assistants and Nurse Practitioners) who all work together to provide you with the care you need, when you need it. You will need a follow up appointment in 12 months.  Please call our office 2 months in advance to schedule this appointment.  You may see Nicki Guadalajara, MD or one of the following Advanced Practice Providers on your designated Care Team: [place], [place] . Micah Flesher, PA-C

## 2018-02-27 DIAGNOSIS — J3081 Allergic rhinitis due to animal (cat) (dog) hair and dander: Secondary | ICD-10-CM | POA: Diagnosis not present

## 2018-02-27 DIAGNOSIS — J3089 Other allergic rhinitis: Secondary | ICD-10-CM | POA: Diagnosis not present

## 2018-02-27 DIAGNOSIS — J301 Allergic rhinitis due to pollen: Secondary | ICD-10-CM | POA: Diagnosis not present

## 2018-03-05 MED FILL — ROSUVASTATIN CALCIUM 20 MG: 20 | 90 days supply | Qty: 90 | Fill #0

## 2018-03-05 MED FILL — MONTELUKAST SOD 10 MG TAB: 10 | 30 days supply | Qty: 30 | Fill #4

## 2018-03-06 DIAGNOSIS — Z23 Encounter for immunization: Secondary | ICD-10-CM | POA: Diagnosis not present

## 2018-03-06 DIAGNOSIS — J3089 Other allergic rhinitis: Secondary | ICD-10-CM | POA: Diagnosis not present

## 2018-03-06 DIAGNOSIS — J301 Allergic rhinitis due to pollen: Secondary | ICD-10-CM | POA: Diagnosis not present

## 2018-03-06 DIAGNOSIS — J3081 Allergic rhinitis due to animal (cat) (dog) hair and dander: Secondary | ICD-10-CM | POA: Diagnosis not present

## 2018-03-13 DIAGNOSIS — J3089 Other allergic rhinitis: Secondary | ICD-10-CM | POA: Diagnosis not present

## 2018-03-13 DIAGNOSIS — J301 Allergic rhinitis due to pollen: Secondary | ICD-10-CM | POA: Diagnosis not present

## 2018-03-13 DIAGNOSIS — J3081 Allergic rhinitis due to animal (cat) (dog) hair and dander: Secondary | ICD-10-CM | POA: Diagnosis not present

## 2018-03-14 DIAGNOSIS — J3081 Allergic rhinitis due to animal (cat) (dog) hair and dander: Secondary | ICD-10-CM | POA: Diagnosis not present

## 2018-03-14 DIAGNOSIS — J301 Allergic rhinitis due to pollen: Secondary | ICD-10-CM | POA: Diagnosis not present

## 2018-03-15 MED FILL — DESLORATADINE 5 MG TABLET: 5 | 30 days supply | Qty: 30 | Fill #4

## 2018-03-22 DIAGNOSIS — J3089 Other allergic rhinitis: Secondary | ICD-10-CM | POA: Diagnosis not present

## 2018-03-22 DIAGNOSIS — J301 Allergic rhinitis due to pollen: Secondary | ICD-10-CM | POA: Diagnosis not present

## 2018-03-22 DIAGNOSIS — J3081 Allergic rhinitis due to animal (cat) (dog) hair and dander: Secondary | ICD-10-CM | POA: Diagnosis not present

## 2018-04-04 MED FILL — MONTELUKAST SOD 10 MG TAB: 10 | 30 days supply | Qty: 30 | Fill #5

## 2018-04-04 MED FILL — REPATHA 140 MG/1 ML INJ: 140 | 28 days supply | Qty: 2 | Fill #2

## 2018-04-15 DIAGNOSIS — J301 Allergic rhinitis due to pollen: Secondary | ICD-10-CM | POA: Diagnosis not present

## 2018-04-15 DIAGNOSIS — J3089 Other allergic rhinitis: Secondary | ICD-10-CM | POA: Diagnosis not present

## 2018-04-15 DIAGNOSIS — J3081 Allergic rhinitis due to animal (cat) (dog) hair and dander: Secondary | ICD-10-CM | POA: Diagnosis not present

## 2018-04-15 MED FILL — DESLORATADINE 5 MG TAB: 5 | 30 days supply | Qty: 30 | Fill #5

## 2018-04-30 DIAGNOSIS — J3089 Other allergic rhinitis: Secondary | ICD-10-CM | POA: Diagnosis not present

## 2018-04-30 DIAGNOSIS — J301 Allergic rhinitis due to pollen: Secondary | ICD-10-CM | POA: Diagnosis not present

## 2018-04-30 DIAGNOSIS — J3081 Allergic rhinitis due to animal (cat) (dog) hair and dander: Secondary | ICD-10-CM | POA: Diagnosis not present

## 2018-05-02 DIAGNOSIS — J3081 Allergic rhinitis due to animal (cat) (dog) hair and dander: Secondary | ICD-10-CM | POA: Diagnosis not present

## 2018-05-02 DIAGNOSIS — J3089 Other allergic rhinitis: Secondary | ICD-10-CM | POA: Diagnosis not present

## 2018-05-02 DIAGNOSIS — J301 Allergic rhinitis due to pollen: Secondary | ICD-10-CM | POA: Diagnosis not present

## 2018-05-06 ENCOUNTER — Other Ambulatory Visit: Payer: Self-pay | Admitting: Cardiovascular Disease

## 2018-05-06 MED FILL — MONTELUKAST SOD 10 MG TAB: 10 | 30 days supply | Qty: 30 | Fill #0

## 2018-05-06 MED FILL — REPATHA 140 MG/1 ML INJ: 140 | 28 days supply | Qty: 2 | Fill #3

## 2018-05-06 MED FILL — EPINEPHRINE 0.3 MG AUTO-INJ: 0.3 | 30 days supply | Qty: 2 | Fill #0

## 2018-05-07 MED FILL — EZETIMIBE 10 MG TABS: 10 | 90 days supply | Qty: 90 | Fill #0

## 2018-05-17 MED FILL — DESLORATADINE 5 MG TAB: 5 | 30 days supply | Qty: 30 | Fill #0

## 2018-05-30 MED FILL — REPATHA SURECLICK 140 MG/ML: 140 | 28 days supply | Qty: 2 | Fill #4

## 2018-06-06 DIAGNOSIS — J301 Allergic rhinitis due to pollen: Secondary | ICD-10-CM | POA: Diagnosis not present

## 2018-06-06 DIAGNOSIS — J3081 Allergic rhinitis due to animal (cat) (dog) hair and dander: Secondary | ICD-10-CM | POA: Diagnosis not present

## 2018-06-06 DIAGNOSIS — J3089 Other allergic rhinitis: Secondary | ICD-10-CM | POA: Diagnosis not present

## 2018-06-12 MED FILL — MONTELUKAST SOD 10 MG TAB: 10 | 30 days supply | Qty: 30 | Fill #1

## 2018-06-12 MED FILL — DESLORATADINE 5 MG TAB: 5 | 30 days supply | Qty: 30 | Fill #1

## 2018-06-13 ENCOUNTER — Ambulatory Visit (INDEPENDENT_AMBULATORY_CARE_PROVIDER_SITE_OTHER): Payer: Self-pay | Admitting: Nurse Practitioner

## 2018-06-13 VITALS — BP 120/82 | HR 98 | Temp 98.7°F | Resp 20 | Wt 245.2 lb

## 2018-06-13 DIAGNOSIS — B001 Herpesviral vesicular dermatitis: Secondary | ICD-10-CM

## 2018-06-13 MED ORDER — VALACYCLOVIR HCL 1 G PO TABS: 2.0000 g | ORAL_TABLET | Freq: Two times a day (BID) | ORAL | 0 refills | Status: AC

## 2018-06-13 MED FILL — valACYclovir HCL 1 GM TABS: 1 | 2 days supply | Qty: 6 | Fill #0

## 2018-06-13 NOTE — Patient Instructions (Signed)
Cold Sore -Take medication as prescribed. -Apply cool compresses to the affected area while symptoms persist. -Purchase over-the-counter lysine to help with the current outbreak until symptoms improve. -Do not kiss others as this is highly contagious. -Follow-up with your PCP for future outbreaks or if suppression is needed.  A cold sore, also called a fever blister, is a small, fluid-filled sore that forms inside the mouth or on the lips, gums, nose, chin, or cheeks. Cold sores can spread to other parts of the body, such as the eyes or fingers. In some people who have other medical conditions, cold sores can spread to multiple other body sites, including the genitals. Cold sores can spread from person to person (are contagious) until the sores crust over completely. Most cold sores go away within 2 weeks. What are the causes? Cold sores are caused by an infection from a common type of herpes simplex virus (HSV-1). HSV-1 is closely related to the HSV-2virus, which is the virus that causes genital herpes, but these viruses are not the same. Once a person is infected with HSV-1, the virus remains permanently in the body. HSV-1 is spread from person to person through close contact, such as through kissing, touching the affected area, or sharing personal items such as lip balm, razors, a drinking glass, or eating utensils. What increases the risk? You are more likely to develop this condition if you:  Are tired, stressed, or sick.  Are menstruating.  Are pregnant.  Take certain medicines.  Are exposed to cold weather or too much sun. What are the signs or symptoms? Symptoms of a cold sore outbreak go through different stages. These are the stages of a cold sore:  Tingling, itching, or burning is felt 1-2 days before the outbreak.  Fluid-filled blisters appear on the lips, inside the mouth, on the nose, or on the cheeks.  The blisters start to ooze clear fluid.  The blisters dry up, and a  yellow crust appears in their place.  The crust falls off. In some cases, other symptoms can develop during a cold sore outbreak. These can include:  Fever.  Sore throat.  Headache.  Muscle aches.  Swollen neck glands. How is this diagnosed? This condition is diagnosed based on your medical history and a physical exam. Your health care provider may do a blood test or may swab some fluid from your sore and then examine the swab in the lab. How is this treated? There is no cure for cold sores or HSV-1. There is also no vaccine for HSV-1. Most cold sores go away on their own without treatment within 2 weeks. Medicines cannot make the infection go away, but your health care provider may prescribe medicines to:  Help relieve some of the pain associated with the sores.  Work to stop the virus from multiplying.  Shorten healing time. Medicines may be in the form of creams, gels, pills, or a shot. Follow these instructions at home: Medicines  Take or apply over-the-counter and prescription medicines only as told by your health care provider.  Use a cotton-tip swab to apply creams or gels to your sores.  Ask your health care provider if you can take lysine supplements. Research has found that lysine may help heal the cold sore faster and prevent outbreaks. Sore care   Do not touch the sores or pick the scabs.  Wash your hands often. Do not touch your eyes without washing your hands first.  Keep the sores clean and dry.  If directed, apply ice to the sores: ? Put ice in a plastic bag. ? Place a towel between your skin and the bag. ? Leave the ice on for 20 minutes, 2-3 times a day. Eating and drinking  Eat a soft, bland diet. Avoid eating hot, cold, or salty foods.  Use a straw if it hurts to drink out of a glass.  Eat foods that are rich in lysine, such as meat, fish, and dairy products.  Avoid sugary foods, chocolates, nuts, and grains. These foods are rich in a nutrient  called arginine, which can cause the virus to multiply. Lifestyle  Do not kiss, have oral sex, or share personal items until your sores heal.  Stress, poor sleep, and being out in the sun can trigger outbreaks. Make sure you: ? Do activities that help you relax, such as deep breathing exercises or meditation. ? Get enough sleep. ? Apply sunscreen on your lips before you go out in the sun. Contact a health care provider if:  You have symptoms for more than 2 weeks.  You have pus coming from the sores.  You have redness that is spreading.  You have pain or irritation in your eye.  You get sores on your genitals.  Your sores do not heal within 2 weeks.  You have frequent cold sore outbreaks. Get help right away if you have:  A fever and your symptoms suddenly get worse.  A headache and confusion.  Fatigue or loss of appetite.  A stiff neck or sensitivity to light. Summary  A cold sore, also called a fever blister, is a small, fluid-filled sore that forms inside the mouth or on the lips, gums, nose, chin, or cheeks.  Most cold sores go away on their own without treatment within 2 weeks. Your health care provider may prescribe medicines to help relieve some of the pain, work to stop the virus from multiplying, and shorten healing time.  Wash your hands often. Do not touch your eyes without washing your hands first.  Do not kiss, have oral sex, or share personal items until your sores heal.  Contact a health care provider if your sores do not heal within 2 weeks. This information is not intended to replace advice given to you by your health care provider. Make sure you discuss any questions you have with your health care provider. Document Released: 04/28/2000 Document Revised: 10/01/2017 Document Reviewed: 10/01/2017 Elsevier Interactive Patient Education  2019 [employer].

## 2018-06-13 NOTE — Progress Notes (Signed)
Subjective:  Jason Osborne is a 33 y.o. male who presents for a fluid filled blister, location right lower lip, with onset of symptoms starting 4 days ago.  Patient complains of pain to the affected site.  The patient states he had a similar outbreak about 2 years ago.  The patient denies close contact with someone that has been affected or direct exposure to sunlight.  Patient further denies fever, chills, difficulty eating, difficulty swallowing, malaise, or other concerns.  Patient states he felt a hardened area to his lip on Sunday before the cold sore started.  Patient denies any previous history of STD/STI.  The patient states that he did get a massage a day before his symptoms began.  Patient states he fell asleep on the massage table and had his face directly on the table during the massage.  Patient is wondering if this may have been the causative action of his symptoms..  Review of Systems  Constitutional: Negative.   HENT: Negative.   Eyes: Negative.   Respiratory: Negative.   Cardiovascular: Negative.   Gastrointestinal: Negative.   Skin:       Cold sore to right lower lip    Objective:  Blood pressure 120/82, pulse 98, temperature 98.7 F (37.1 C), temperature source Oral, resp. rate 20, weight 245 lb 3.2 oz (111.2 kg), SpO2 100 %.  Physical Exam Vitals signs reviewed.  Constitutional:      General: He is not in acute distress.    Appearance: Normal appearance.  HENT:     Head: Normocephalic.     Right Ear: Tympanic membrane and ear canal normal.     Left Ear: Tympanic membrane and ear canal normal.     Nose: Nose normal.     Mouth/Throat:     Mouth: Mucous membranes are moist.     Pharynx: Posterior oropharyngeal erythema ( erythema to right lower lip) present.     Comments: +fluild filled blister to inner right lower lip. 2nd blister forming to border of right lower lip. + erythema, no drainage.  No crusting at present. Eyes:     Pupils: Pupils are equal, round, and  reactive to light.  Neck:     Musculoskeletal: Normal range of motion and neck supple. No neck rigidity.  Cardiovascular:     Rate and Rhythm: Normal rate and regular rhythm.     Pulses: Normal pulses.     Heart sounds: Normal heart sounds.  Pulmonary:     Effort: Pulmonary effort is normal.     Breath sounds: Normal breath sounds.  Abdominal:     General: Abdomen is flat. There is no distension.     Tenderness: There is no abdominal tenderness.  Skin:    General: Skin is warm and dry.     Capillary Refill: Capillary refill takes less than 2 seconds.  Neurological:     General: No focal deficit present.     Mental Status: He is alert and oriented to person, place, and time.     Cranial Nerves: No cranial nerve deficit.  Psychiatric:        Mood and Affect: Mood normal.        Thought Content: Thought content normal.     Assessment:   Herpes Labialis     Plan:  Exam findings, diagnosis etiology and medication use and indications reviewed with patient. Follow- Up and discharge instructions provided. No emergent/urgent issues found on exam.  Some the patient's clinical presentation, symptoms, and physical assessment, patient's  findings are congruent with that of herpes labialis.  Based on the patient's HPI they included he did have a history of this condition prior, will go ahead and provide the patient with symptomatic treatment.  We will treat patient with valacyclovir 2 g twice daily for 1 day.  Discussed with patient other preventative things that he could try to include cool compresses to the affected area at the first onset of symptoms, use of lysine, and to avoid stress or sunlight.  Also informed patient that his symptoms are highly contagious and he should avoid close contact to include kissing.  Patient education was provided. Patient verbalized understanding of information provided and agrees with plan of care (POC), all questions answered. The patient is advised to call or  return to clinic if condition does not see an improvement in symptoms, or to seek the care of the closest emergency department if condition worsens with the above plan.   1. Cold sore  - valACYclovir (VALTREX) 1000 MG tablet; Take 2 tablets (2,000 mg total) by mouth 2 (two) times daily for 1 day.  Dispense: 6 tablet; Refill: 0 -Take medication as prescribed. -Apply cool compresses to the affected area while symptoms persist. -Purchase over-the-counter lysine to help with the current outbreak until symptoms improve. -Do not kiss others as this is highly contagious. -Follow-up with your PCP for future outbreaks or if suppression is needed.

## 2018-06-25 DIAGNOSIS — J3089 Other allergic rhinitis: Secondary | ICD-10-CM | POA: Diagnosis not present

## 2018-06-25 DIAGNOSIS — J3081 Allergic rhinitis due to animal (cat) (dog) hair and dander: Secondary | ICD-10-CM | POA: Diagnosis not present

## 2018-06-25 DIAGNOSIS — J301 Allergic rhinitis due to pollen: Secondary | ICD-10-CM | POA: Diagnosis not present

## 2018-06-27 DIAGNOSIS — J3089 Other allergic rhinitis: Secondary | ICD-10-CM | POA: Diagnosis not present

## 2018-06-27 DIAGNOSIS — J3081 Allergic rhinitis due to animal (cat) (dog) hair and dander: Secondary | ICD-10-CM | POA: Diagnosis not present

## 2018-06-27 DIAGNOSIS — J301 Allergic rhinitis due to pollen: Secondary | ICD-10-CM | POA: Diagnosis not present

## 2018-07-02 ENCOUNTER — Telehealth: Payer: Self-pay

## 2018-07-02 DIAGNOSIS — J3089 Other allergic rhinitis: Secondary | ICD-10-CM | POA: Diagnosis not present

## 2018-07-02 NOTE — Telephone Encounter (Signed)
Called and left a msg letting the pt know that labs are needed for the repatha because the pa will be due in sept

## 2018-07-10 ENCOUNTER — Other Ambulatory Visit: Payer: Self-pay | Admitting: Cardiovascular Disease

## 2018-07-10 DIAGNOSIS — J3081 Allergic rhinitis due to animal (cat) (dog) hair and dander: Secondary | ICD-10-CM | POA: Diagnosis not present

## 2018-07-10 DIAGNOSIS — Z79899 Other long term (current) drug therapy: Secondary | ICD-10-CM | POA: Diagnosis not present

## 2018-07-10 DIAGNOSIS — R945 Abnormal results of liver function studies: Secondary | ICD-10-CM | POA: Diagnosis not present

## 2018-07-10 DIAGNOSIS — J301 Allergic rhinitis due to pollen: Secondary | ICD-10-CM | POA: Diagnosis not present

## 2018-07-10 DIAGNOSIS — E7801 Familial hypercholesterolemia: Secondary | ICD-10-CM | POA: Diagnosis not present

## 2018-07-10 DIAGNOSIS — J3089 Other allergic rhinitis: Secondary | ICD-10-CM | POA: Diagnosis not present

## 2018-07-10 LAB — HEPATIC FUNCTION PANEL
ALBUMIN: 4.3 g/dL (ref 4.0–5.0)
ALK PHOS: 96 IU/L (ref 39–117)
ALT: 78 IU/L — AB (ref 0–44)
AST: 48 IU/L — ABNORMAL HIGH (ref 0–40)
BILIRUBIN, DIRECT: 0.14 mg/dL (ref 0.00–0.40)
Bilirubin Total: 0.4 mg/dL (ref 0.0–1.2)
TOTAL PROTEIN: 6.8 g/dL (ref 6.0–8.5)

## 2018-07-10 LAB — LIPID PANEL
CHOL/HDL RATIO: 3 ratio (ref 0.0–5.0)
Cholesterol, Total: 148 mg/dL (ref 100–199)
HDL: 49 mg/dL (ref >39)
LDL Calculated: 89 mg/dL (ref 0–99)
Triglycerides: 52 mg/dL (ref 0–149)
VLDL CHOLESTEROL CAL: 10 mg/dL (ref 5–40)

## 2018-07-10 MED FILL — ROSUVASTATIN CALCIUM 20 MG: 20 | 90 days supply | Qty: 90 | Fill #0

## 2018-07-10 MED FILL — REPATHA SURECLICK 140 MG/ML: 140 | 28 days supply | Qty: 2 | Fill #5

## 2018-07-11 ENCOUNTER — Other Ambulatory Visit: Payer: Self-pay

## 2018-07-11 DIAGNOSIS — R945 Abnormal results of liver function studies: Secondary | ICD-10-CM

## 2018-07-11 DIAGNOSIS — R7989 Other specified abnormal findings of blood chemistry: Secondary | ICD-10-CM

## 2018-07-16 DIAGNOSIS — J301 Allergic rhinitis due to pollen: Secondary | ICD-10-CM | POA: Diagnosis not present

## 2018-07-16 DIAGNOSIS — J3089 Other allergic rhinitis: Secondary | ICD-10-CM | POA: Diagnosis not present

## 2018-07-16 DIAGNOSIS — J3081 Allergic rhinitis due to animal (cat) (dog) hair and dander: Secondary | ICD-10-CM | POA: Diagnosis not present

## 2018-07-16 MED FILL — MONTELUKAST SOD 10 MG TAB: 10 | 30 days supply | Qty: 30 | Fill #2

## 2018-07-16 MED FILL — DESLORATADINE 5 MG TAB: 5 | 30 days supply | Qty: 30 | Fill #2

## 2018-07-24 DIAGNOSIS — K219 Gastro-esophageal reflux disease without esophagitis: Secondary | ICD-10-CM | POA: Diagnosis not present

## 2018-07-24 DIAGNOSIS — Z6835 Body mass index (BMI) 35.0-35.9, adult: Secondary | ICD-10-CM | POA: Diagnosis not present

## 2018-07-25 DIAGNOSIS — J3081 Allergic rhinitis due to animal (cat) (dog) hair and dander: Secondary | ICD-10-CM | POA: Diagnosis not present

## 2018-07-25 DIAGNOSIS — J301 Allergic rhinitis due to pollen: Secondary | ICD-10-CM | POA: Diagnosis not present

## 2018-07-25 DIAGNOSIS — J3089 Other allergic rhinitis: Secondary | ICD-10-CM | POA: Diagnosis not present

## 2018-08-02 DIAGNOSIS — J3081 Allergic rhinitis due to animal (cat) (dog) hair and dander: Secondary | ICD-10-CM | POA: Diagnosis not present

## 2018-08-02 DIAGNOSIS — J301 Allergic rhinitis due to pollen: Secondary | ICD-10-CM | POA: Diagnosis not present

## 2018-08-02 DIAGNOSIS — J3089 Other allergic rhinitis: Secondary | ICD-10-CM | POA: Diagnosis not present

## 2018-08-13 DIAGNOSIS — J301 Allergic rhinitis due to pollen: Secondary | ICD-10-CM | POA: Diagnosis not present

## 2018-08-13 DIAGNOSIS — J3081 Allergic rhinitis due to animal (cat) (dog) hair and dander: Secondary | ICD-10-CM | POA: Diagnosis not present

## 2018-08-13 DIAGNOSIS — J3089 Other allergic rhinitis: Secondary | ICD-10-CM | POA: Diagnosis not present

## 2018-08-15 MED FILL — EZETIMIBE 10 MG TABS: 10 | 90 days supply | Qty: 90 | Fill #1

## 2018-08-15 MED FILL — DESLORATADINE 5 MG TAB: 5 | 30 days supply | Qty: 30 | Fill #3

## 2018-08-15 MED FILL — MONTELUKAST SOD 10 MG TAB: 10 | 30 days supply | Qty: 30 | Fill #3

## 2018-08-16 MED FILL — REPATHA SURECLICK 140 MG/ML: 140 | 28 days supply | Qty: 2 | Fill #6

## 2018-08-28 DIAGNOSIS — J3081 Allergic rhinitis due to animal (cat) (dog) hair and dander: Secondary | ICD-10-CM | POA: Diagnosis not present

## 2018-08-28 DIAGNOSIS — J3089 Other allergic rhinitis: Secondary | ICD-10-CM | POA: Diagnosis not present

## 2018-08-28 DIAGNOSIS — J301 Allergic rhinitis due to pollen: Secondary | ICD-10-CM | POA: Diagnosis not present

## 2018-09-04 DIAGNOSIS — J3081 Allergic rhinitis due to animal (cat) (dog) hair and dander: Secondary | ICD-10-CM | POA: Diagnosis not present

## 2018-09-04 DIAGNOSIS — J301 Allergic rhinitis due to pollen: Secondary | ICD-10-CM | POA: Diagnosis not present

## 2018-09-04 DIAGNOSIS — J3089 Other allergic rhinitis: Secondary | ICD-10-CM | POA: Diagnosis not present

## 2018-09-16 MED FILL — MONTELUKAST SOD 10 MG TAB: 10 | 30 days supply | Qty: 30 | Fill #4

## 2018-09-16 MED FILL — DESLORATADINE 5 MG TAB: 5 | 30 days supply | Qty: 30 | Fill #4

## 2018-09-20 DIAGNOSIS — J3081 Allergic rhinitis due to animal (cat) (dog) hair and dander: Secondary | ICD-10-CM | POA: Diagnosis not present

## 2018-09-20 DIAGNOSIS — J301 Allergic rhinitis due to pollen: Secondary | ICD-10-CM | POA: Diagnosis not present

## 2018-09-20 DIAGNOSIS — J3089 Other allergic rhinitis: Secondary | ICD-10-CM | POA: Diagnosis not present

## 2018-09-25 DIAGNOSIS — J301 Allergic rhinitis due to pollen: Secondary | ICD-10-CM | POA: Diagnosis not present

## 2018-09-25 DIAGNOSIS — J3081 Allergic rhinitis due to animal (cat) (dog) hair and dander: Secondary | ICD-10-CM | POA: Diagnosis not present

## 2018-09-25 DIAGNOSIS — J3089 Other allergic rhinitis: Secondary | ICD-10-CM | POA: Diagnosis not present

## 2018-10-01 DIAGNOSIS — J3081 Allergic rhinitis due to animal (cat) (dog) hair and dander: Secondary | ICD-10-CM | POA: Diagnosis not present

## 2018-10-01 DIAGNOSIS — J3089 Other allergic rhinitis: Secondary | ICD-10-CM | POA: Diagnosis not present

## 2018-10-01 DIAGNOSIS — J301 Allergic rhinitis due to pollen: Secondary | ICD-10-CM | POA: Diagnosis not present

## 2018-10-15 ENCOUNTER — Other Ambulatory Visit: Payer: Self-pay | Admitting: Cardiovascular Disease

## 2018-10-15 DIAGNOSIS — J3089 Other allergic rhinitis: Secondary | ICD-10-CM | POA: Diagnosis not present

## 2018-10-15 DIAGNOSIS — J3081 Allergic rhinitis due to animal (cat) (dog) hair and dander: Secondary | ICD-10-CM | POA: Diagnosis not present

## 2018-10-15 DIAGNOSIS — J301 Allergic rhinitis due to pollen: Secondary | ICD-10-CM | POA: Diagnosis not present

## 2018-10-15 MED FILL — REPATHA SURECLICK 140 MG/ML: 140 | 28 days supply | Qty: 2 | Fill #7

## 2018-10-15 MED FILL — MONTELUKAST SOD 10 MG TAB: 10 | 30 days supply | Qty: 30 | Fill #5

## 2018-10-15 MED FILL — ROSUVASTATIN CALCIUM 20 MG: 20 | 90 days supply | Qty: 90 | Fill #0

## 2018-10-15 MED FILL — DESLORATADINE 5 MG TAB: 5 | 30 days supply | Qty: 30 | Fill #5

## 2018-10-21 DIAGNOSIS — J3081 Allergic rhinitis due to animal (cat) (dog) hair and dander: Secondary | ICD-10-CM | POA: Diagnosis not present

## 2018-10-21 DIAGNOSIS — J3089 Other allergic rhinitis: Secondary | ICD-10-CM | POA: Diagnosis not present

## 2018-10-21 DIAGNOSIS — J301 Allergic rhinitis due to pollen: Secondary | ICD-10-CM | POA: Diagnosis not present

## 2018-11-06 DIAGNOSIS — J3089 Other allergic rhinitis: Secondary | ICD-10-CM | POA: Diagnosis not present

## 2018-11-06 DIAGNOSIS — J3081 Allergic rhinitis due to animal (cat) (dog) hair and dander: Secondary | ICD-10-CM | POA: Diagnosis not present

## 2018-11-06 DIAGNOSIS — J301 Allergic rhinitis due to pollen: Secondary | ICD-10-CM | POA: Diagnosis not present

## 2018-11-13 DIAGNOSIS — J3089 Other allergic rhinitis: Secondary | ICD-10-CM | POA: Diagnosis not present

## 2018-11-13 DIAGNOSIS — J301 Allergic rhinitis due to pollen: Secondary | ICD-10-CM | POA: Diagnosis not present

## 2018-11-13 DIAGNOSIS — J3081 Allergic rhinitis due to animal (cat) (dog) hair and dander: Secondary | ICD-10-CM | POA: Diagnosis not present

## 2018-11-15 MED FILL — EZETIMIBE 10 MG TABS: 10 | 90 days supply | Qty: 90 | Fill #2

## 2018-11-18 MED FILL — MONTELUKAST SOD 10 MG TAB: 10 | 30 days supply | Qty: 30 | Fill #0

## 2018-11-18 MED FILL — DESLORATADINE 5 MG TAB: 5 | 30 days supply | Qty: 30 | Fill #0

## 2018-11-19 DIAGNOSIS — J3089 Other allergic rhinitis: Secondary | ICD-10-CM | POA: Diagnosis not present

## 2018-11-19 DIAGNOSIS — J3081 Allergic rhinitis due to animal (cat) (dog) hair and dander: Secondary | ICD-10-CM | POA: Diagnosis not present

## 2018-11-19 DIAGNOSIS — J301 Allergic rhinitis due to pollen: Secondary | ICD-10-CM | POA: Diagnosis not present

## 2018-11-27 DIAGNOSIS — J3089 Other allergic rhinitis: Secondary | ICD-10-CM | POA: Diagnosis not present

## 2018-11-27 DIAGNOSIS — J301 Allergic rhinitis due to pollen: Secondary | ICD-10-CM | POA: Diagnosis not present

## 2018-11-27 DIAGNOSIS — J3081 Allergic rhinitis due to animal (cat) (dog) hair and dander: Secondary | ICD-10-CM | POA: Diagnosis not present

## 2018-12-09 DIAGNOSIS — J301 Allergic rhinitis due to pollen: Secondary | ICD-10-CM | POA: Diagnosis not present

## 2018-12-09 DIAGNOSIS — J3081 Allergic rhinitis due to animal (cat) (dog) hair and dander: Secondary | ICD-10-CM | POA: Diagnosis not present

## 2018-12-09 DIAGNOSIS — J3089 Other allergic rhinitis: Secondary | ICD-10-CM | POA: Diagnosis not present

## 2018-12-09 MED FILL — REPATHA SURECLICK 140 MG/ML: 140 | 28 days supply | Qty: 2 | Fill #8

## 2018-12-16 MED FILL — MONTELUKAST SOD 10 MG TAB: 10 | 30 days supply | Qty: 30 | Fill #0

## 2018-12-16 MED FILL — DESLORATADINE 5 MG TABS: 5 | 30 days supply | Qty: 30 | Fill #1

## 2018-12-26 DIAGNOSIS — J301 Allergic rhinitis due to pollen: Secondary | ICD-10-CM | POA: Diagnosis not present

## 2018-12-26 DIAGNOSIS — J3081 Allergic rhinitis due to animal (cat) (dog) hair and dander: Secondary | ICD-10-CM | POA: Diagnosis not present

## 2018-12-26 DIAGNOSIS — J3089 Other allergic rhinitis: Secondary | ICD-10-CM | POA: Diagnosis not present

## 2019-01-01 DIAGNOSIS — J301 Allergic rhinitis due to pollen: Secondary | ICD-10-CM | POA: Diagnosis not present

## 2019-01-01 DIAGNOSIS — J3089 Other allergic rhinitis: Secondary | ICD-10-CM | POA: Diagnosis not present

## 2019-01-01 DIAGNOSIS — J3081 Allergic rhinitis due to animal (cat) (dog) hair and dander: Secondary | ICD-10-CM | POA: Diagnosis not present

## 2019-01-08 DIAGNOSIS — J301 Allergic rhinitis due to pollen: Secondary | ICD-10-CM | POA: Diagnosis not present

## 2019-01-08 DIAGNOSIS — J3081 Allergic rhinitis due to animal (cat) (dog) hair and dander: Secondary | ICD-10-CM | POA: Diagnosis not present

## 2019-01-08 DIAGNOSIS — J3089 Other allergic rhinitis: Secondary | ICD-10-CM | POA: Diagnosis not present

## 2019-01-13 MED FILL — REPATHA SURECLICK 140 MG/ML: 140 | 28 days supply | Qty: 2 | Fill #0

## 2019-01-13 MED FILL — DESLORATADINE 5 MG TABS: 5 | 30 days supply | Qty: 30 | Fill #2

## 2019-01-13 MED FILL — MONTELUKAST SOD 10 MG TAB: 10 | 30 days supply | Qty: 30 | Fill #1

## 2019-01-15 DIAGNOSIS — J301 Allergic rhinitis due to pollen: Secondary | ICD-10-CM | POA: Diagnosis not present

## 2019-01-15 DIAGNOSIS — J3081 Allergic rhinitis due to animal (cat) (dog) hair and dander: Secondary | ICD-10-CM | POA: Diagnosis not present

## 2019-01-15 DIAGNOSIS — J3089 Other allergic rhinitis: Secondary | ICD-10-CM | POA: Diagnosis not present

## 2019-01-28 DIAGNOSIS — J3089 Other allergic rhinitis: Secondary | ICD-10-CM | POA: Diagnosis not present

## 2019-01-28 DIAGNOSIS — J301 Allergic rhinitis due to pollen: Secondary | ICD-10-CM | POA: Diagnosis not present

## 2019-01-28 DIAGNOSIS — J3081 Allergic rhinitis due to animal (cat) (dog) hair and dander: Secondary | ICD-10-CM | POA: Diagnosis not present

## 2019-02-05 DIAGNOSIS — J301 Allergic rhinitis due to pollen: Secondary | ICD-10-CM | POA: Diagnosis not present

## 2019-02-05 DIAGNOSIS — J3081 Allergic rhinitis due to animal (cat) (dog) hair and dander: Secondary | ICD-10-CM | POA: Diagnosis not present

## 2019-02-05 DIAGNOSIS — R945 Abnormal results of liver function studies: Secondary | ICD-10-CM | POA: Diagnosis not present

## 2019-02-05 DIAGNOSIS — J3089 Other allergic rhinitis: Secondary | ICD-10-CM | POA: Diagnosis not present

## 2019-02-05 LAB — HEPATIC FUNCTION PANEL
ALT: 71 IU/L — ABNORMAL HIGH (ref 0–44)
AST: 44 IU/L — ABNORMAL HIGH (ref 0–40)
Albumin: 4.6 g/dL (ref 4.0–5.0)
Alkaline Phosphatase: 91 IU/L (ref 39–117)
Bilirubin Total: 1 mg/dL (ref 0.0–1.2)
Bilirubin, Direct: 0.32 mg/dL (ref 0.00–0.40)
Total Protein: 6.8 g/dL (ref 6.0–8.5)

## 2019-02-05 LAB — LIPID PANEL
Chol/HDL Ratio: 1.7 ratio (ref 0.0–5.0)
Cholesterol, Total: 77 mg/dL — ABNORMAL LOW (ref 100–199)
HDL: 45 mg/dL (ref >39)
LDL Chol Calc (NIH): 21 mg/dL (ref 0–99)
Triglycerides: 33 mg/dL (ref 0–149)
VLDL Cholesterol Cal: 11 mg/dL (ref 5–40)

## 2019-02-06 DIAGNOSIS — J3089 Other allergic rhinitis: Secondary | ICD-10-CM | POA: Diagnosis not present

## 2019-02-14 DIAGNOSIS — J301 Allergic rhinitis due to pollen: Secondary | ICD-10-CM | POA: Diagnosis not present

## 2019-02-14 DIAGNOSIS — J3081 Allergic rhinitis due to animal (cat) (dog) hair and dander: Secondary | ICD-10-CM | POA: Diagnosis not present

## 2019-02-14 DIAGNOSIS — J3089 Other allergic rhinitis: Secondary | ICD-10-CM | POA: Diagnosis not present

## 2019-02-14 MED FILL — MONTELUKAST SOD 10 MG TAB: 10 | 30 days supply | Qty: 30 | Fill #2

## 2019-02-14 MED FILL — DESLORATADINE 5 MG TABS: 5 | 30 days supply | Qty: 30 | Fill #3

## 2019-02-18 ENCOUNTER — Other Ambulatory Visit: Payer: Self-pay | Admitting: Cardiovascular Disease

## 2019-02-18 MED FILL — EZETIMIBE 10 MG TABS: 10 | 90 days supply | Qty: 90 | Fill #0

## 2019-02-20 DIAGNOSIS — J301 Allergic rhinitis due to pollen: Secondary | ICD-10-CM | POA: Diagnosis not present

## 2019-02-20 DIAGNOSIS — J3089 Other allergic rhinitis: Secondary | ICD-10-CM | POA: Diagnosis not present

## 2019-02-20 DIAGNOSIS — J3081 Allergic rhinitis due to animal (cat) (dog) hair and dander: Secondary | ICD-10-CM | POA: Diagnosis not present

## 2019-02-25 MED FILL — REPATHA SURECLICK 140 MG/ML: 140 | 28 days supply | Qty: 2 | Fill #0

## 2019-02-26 DIAGNOSIS — J3081 Allergic rhinitis due to animal (cat) (dog) hair and dander: Secondary | ICD-10-CM | POA: Diagnosis not present

## 2019-02-26 DIAGNOSIS — J3089 Other allergic rhinitis: Secondary | ICD-10-CM | POA: Diagnosis not present

## 2019-02-26 DIAGNOSIS — J301 Allergic rhinitis due to pollen: Secondary | ICD-10-CM | POA: Diagnosis not present

## 2019-03-07 DIAGNOSIS — J301 Allergic rhinitis due to pollen: Secondary | ICD-10-CM | POA: Diagnosis not present

## 2019-03-07 DIAGNOSIS — J3081 Allergic rhinitis due to animal (cat) (dog) hair and dander: Secondary | ICD-10-CM | POA: Diagnosis not present

## 2019-03-07 DIAGNOSIS — J3089 Other allergic rhinitis: Secondary | ICD-10-CM | POA: Diagnosis not present

## 2019-03-12 DIAGNOSIS — J301 Allergic rhinitis due to pollen: Secondary | ICD-10-CM | POA: Diagnosis not present

## 2019-03-12 DIAGNOSIS — J3089 Other allergic rhinitis: Secondary | ICD-10-CM | POA: Diagnosis not present

## 2019-03-12 DIAGNOSIS — J3081 Allergic rhinitis due to animal (cat) (dog) hair and dander: Secondary | ICD-10-CM | POA: Diagnosis not present

## 2019-03-18 MED FILL — MONTELUKAST SOD 10 MG TAB: 10 | 30 days supply | Qty: 30 | Fill #3

## 2019-03-31 ENCOUNTER — Ambulatory Visit (INDEPENDENT_AMBULATORY_CARE_PROVIDER_SITE_OTHER): Payer: BC Managed Care – PPO | Admitting: Cardiovascular Disease

## 2019-03-31 ENCOUNTER — Encounter: Payer: Self-pay | Admitting: Cardiovascular Disease

## 2019-03-31 ENCOUNTER — Other Ambulatory Visit: Payer: Self-pay

## 2019-03-31 VITALS — BP 121/79 | HR 75 | Temp 97.9°F | Ht 68.0 in | Wt 214.2 lb

## 2019-03-31 DIAGNOSIS — R7989 Other specified abnormal findings of blood chemistry: Secondary | ICD-10-CM

## 2019-03-31 DIAGNOSIS — E7801 Familial hypercholesterolemia: Secondary | ICD-10-CM

## 2019-03-31 DIAGNOSIS — R945 Abnormal results of liver function studies: Secondary | ICD-10-CM | POA: Diagnosis not present

## 2019-03-31 DIAGNOSIS — Z8249 Family history of ischemic heart disease and other diseases of the circulatory system: Secondary | ICD-10-CM

## 2019-03-31 DIAGNOSIS — E6609 Other obesity due to excess calories: Secondary | ICD-10-CM

## 2019-03-31 DIAGNOSIS — Z79899 Other long term (current) drug therapy: Secondary | ICD-10-CM

## 2019-03-31 DIAGNOSIS — Z6832 Body mass index (BMI) 32.0-32.9, adult: Secondary | ICD-10-CM

## 2019-03-31 MED ORDER — ROSUVASTATIN CALCIUM 10 MG PO TABS: 10.0000 mg | ORAL_TABLET | ORAL | 11 refills | Status: DC

## 2019-03-31 MED FILL — ROSUVASTATIN CALCIUM 10 MG: 10 | 90 days supply | Qty: 45 | Fill #0

## 2019-03-31 NOTE — Patient Instructions (Signed)
Medication Instructions:  DECREASE CRESTOR 10MG  EVER-OTHER-DAY If you need a refill on your cardiac medications before your next appointment, please call your pharmacy.  Labwork: FASTING LIPID PANEL, LFT-IN 3 MONTHS(FEB 2021) HERE IN OUR OFFICE AT [facility]   You will need to fast. DO NOT EAT OR DRINK PAST MIDNIGHT.       If you have labs (blood work) drawn today and your tests are completely normal, you will receive your results only by: Marland Kitchen MyChart Message (if you have MyChart) OR . A paper copy in the mail If you have any lab test that is abnormal or we need to change your treatment, we will call you to review the results.  Follow-Up: IN 6 months Please call our office 2 months in advance, FEB 2021 to schedule this MAY 2021 appointment. In Person Shelva Majestic, MD.    At [facility], you and your health needs are our priority.  As part of our continuing mission to provide you with exceptional heart care, we have created designated Provider Care Teams.  These Care Teams include your primary Cardiologist (physician) and Advanced Practice Providers (APPs -  Physician Assistants and Nurse Practitioners) who all work together to provide you with the care you need, when you need it.  Thank you for choosing [facility] at [facility]!!

## 2019-03-31 NOTE — Progress Notes (Signed)
Patient ID: Jason Osborne, male   DOB: [birth year], 33 y.o.   MRN: [id number]     HPI: Jason Osborne is a 33 y.o. male who presents to the office today for a 41 month cardiology evaluation.  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  He tells me that at age 28 he was first diagnosed as having total cholesterols in excess of 400.  He was evaluated at [facility] and initiated therapy with crystallize vitamin C.  Also was started on what sounds like bile acid sequestrant RV.  He was started on statins at age 72.  He had been on Lipitor but developed some mild LFT elevation. More recently he has  been on lower dose Crestor as well as combination with Zetia. He has strong family history for coronary artery disease in that his father died of myocardial infarction at age 56. Multiple family members  had coronary artery disease as well as significant aortic valve stenosis.  He has had issues with occasional myalgias in the past on Lipitor and on higher dose Crestor.  Over the past year, due to Zetia not being generic at the time and the cost was significant.  He discontinued Zetia and was maintained on only low-dose Crestor.  Blood work from 07/18/2016 revealed a total cholesterol 177, triglycerides 56, HDL 44, and LDL 122.  AST was 40, and ALT was minimally increased at 70.  Bilirubin was normal, as were other electrolytes.  His hemoglobin and hematocrit were stable.  When I saw him in March 2018, he had been off Zetia and had only been taking Crestor at 10 mg.  I recommended reinitiation of Zetia 10 mg and increase Crestor to 20 mg.  Repeat laboratory on this therapy showed improvement but continues to show an LDL cholesterol at 104 with total cholesterol 165, triglycerides 79 and HDL 45.    When I saw him in follow-up in May 2018 he was not a candidate for further titration of Crestor due to LFT elevation.  I initiated the process for Repatha.  I last saw him  in October 2019 at which time he was on Repatha for over year  with excellent LDL reduction.  Laboratory 2 weeks prior to that office visit has shown a total cholesterol at 89, triglycerides 58, HDL 47, and LDL cholesterol at 30.  His LFTs remain mildly increased with an AST of 43 and ALT of 76.  He was contacted by telephone and his Crestor was reduced from 20 mg to 10 mg.  Over the year, he had gained weight.  His peak weight had increased to 242.  He does karate several days per week and most recently he admits to an 11 pound weight loss.  He denies any chest pain palpitations PND orthopnea.    Since I last saw him in October 2019, he has done very well.  He has had purposeful weight loss and has lost approximately 35 pounds.  He had undergone recent laboratory on February 05, 2019 which showed a total cholesterol 77, HDL 45, LDL 21 and triglycerides 33.  AST had improved to 44 but ALT remains elevated at 71.  He has been taking Crestor 10 mg daily.  He presents for follow-up evaluation.  Past Medical History:  Diagnosis Date  . Hyperlipidemia     Past Surgical History:  Procedure Laterality Date  . HAND SURGERY Left   . VASECTOMY      No Known Allergies  Current Outpatient Medications  Medication Sig Dispense Refill  . Desloratadine (CLARINEX PO) Take 1 tablet by mouth daily.    Marland Kitchen EPINEPHrine 0.3 mg/0.3 mL IJ SOAJ injection   1  . ezetimibe (ZETIA) 10 MG tablet Take 1 tablet (10 mg total) by mouth daily. 90 tablet 3  . montelukast (SINGULAIR) 10 MG tablet Take 10 mg by mouth at bedtime.    . NON FORMULARY Allergy injection    . omeprazole (PRILOSEC) 10 MG capsule Take 10 mg by mouth daily.    Marland Kitchen REPATHA SURECLICK 544 MG/ML SOAJ INJECT 140 MG INTO THE SKIN EVERY 14 DAYS. 2 mL 2  . rosuvastatin (CRESTOR) 10 MG tablet Take 1 tablet (10 mg total) by mouth every other day. 15 tablet 11   No current facility-administered medications for this visit.     Social History   Socioeconomic  History  . Marital status: Married    Spouse name: Not on file  . Number of children: Not on file  . Years of education: Not on file  . Highest education level: Not on file  Occupational History  . Not on file  Social Needs  . Financial resource strain: Not on file  . Food insecurity    Worry: Not on file    Inability: Not on file  . Transportation needs    Medical: Not on file    Non-medical: Not on file  Tobacco Use  . Smoking status: Never Smoker  . Smokeless tobacco: Never Used  Substance and Sexual Activity  . Alcohol use: No  . Drug use: No  . Sexual activity: Not on file  Lifestyle  . Physical activity    Days per week: Not on file    Minutes per session: Not on file  . Stress: Not on file  Relationships  . Social Herbalist on phone: Not on file    Gets together: Not on file    Attends religious service: Not on file    Active member of club or organization: Not on file    Attends meetings of clubs or organizations: Not on file    Relationship status: Not on file  . Intimate partner violence    Fear of current or ex partner: Not on file    Emotionally abused: Not on file    Physically abused: Not on file    Forced sexual activity: Not on file  Other Topics Concern  . Not on file  Social History Narrative  . Not on file    Family History  Problem Relation Age of Onset  . Hypertension Mother   . Heart attack Father 55  . Heart disease Father   . Hyperlipidemia Brother   . Hyperlipidemia Maternal Grandfather   . Hypertension Maternal Grandfather   . Cancer - Lung Maternal Grandfather   . Hyperlipidemia Brother   . Heart disease Paternal Grandfather   . Hyperlipidemia Paternal Grandfather   . Hypertension Paternal Grandfather   . Melanoma Paternal Grandfather    Additional social history is notable that he is married.  He has 3 children. 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  ROS General: Negative; No fevers, chills, or night sweats;  HEENT: Negative; No changes in vision or hearing, sinus congestion, difficulty swallowing Pulmonary: Negative; No cough, wheezing, shortness of breath, hemoptysis Cardiovascular: Negative; No chest pain, presyncope, syncope, palpitations GI: Negative; No nausea, vomiting, diarrhea,  or abdominal pain GU: Negative; No dysuria, hematuria, or difficulty voiding Musculoskeletal: Negative; no myalgias, joint pain, or weakness Hematologic/Oncology: Negative; no easy bruising, bleeding Endocrine: Negative; no heat/cold intolerance; no diabetes Neuro: Negative; no changes in balance, headaches Skin: Negative; No rashes or skin lesions Psychiatric: Negative; No behavioral problems, depression Sleep: Negative; No snoring, daytime sleepiness, hypersomnolence, bruxism, restless legs, hypnogognic hallucinations, no cataplexy Other comprehensive 14 point system review is negative.   PE BP 121/79   Pulse 75   Temp 97.9 F (36.6 C)   Ht _0  (1.727 m)   Wt 214 lb 3.2 oz (97.2 kg)   SpO2 98%   BMI 32.57 kg/m    Repeat blood pressure by me was 112/78.  He states his home weight today was 205 down from a peak weight of 242.  Wt Readings from Last 3 Encounters:  03/31/19 214 lb 3.2 oz (97.2 kg)  06/13/18 245 lb 3.2 oz (111.2 kg)  02/25/18 237 lb 6.4 oz (107.7 kg)    General: Alert, oriented, no distress.  Skin: normal turgor, no rashes, warm and dry HEENT: Normocephalic, atraumatic. Pupils equal round and reactive to light; sclera anicteric; extraocular muscles intact;  Nose without nasal septal hypertrophy Mouth/Parynx benign; Mallinpatti scale 3 Neck: No JVD, no carotid bruits; normal carotid upstroke Lungs: clear to ausculatation and percussion; no wheezing or rales Chest wall: without tenderness to palpitation Heart: PMI not displaced, RRR, s1 s2 normal, 1/6 systolic murmur, no diastolic murmur, no rubs, gallops,  thrills, or heaves Abdomen: soft, nontender; no hepatosplenomehaly, BS+; abdominal aorta nontender and not dilated by palpation. Back: no CVA tenderness Pulses 2+ Musculoskeletal: full range of motion, normal strength, no joint deformities Extremities: no clubbing cyanosis or edema, Homan's sign negative  Neurologic: grossly nonfocal; Cranial nerves grossly wnl Psychologic: Normal mood and affect   ECG (independently read by me): Normal sinus rhythm at 75 bpm.  Normal intervals.  No ectopy.  October 2019 ECG (independently read by me): Normal sinus rhythm at 61 bpm.  No ectopy.  Normal intervals.  Nondiagnostic T change in lead III.  Sep 26, 2016 ECG (independently read by me): Normal sinus rhythm at 63 bpm.  Nondiagnostic T wave inversion in lead 3.  Normal intervals.  07/20/2016 ECG (independently read by me): Normal sinus rhythm with mild sinus arrhythmia.  Normal intervals.  Non-diagnostic T wave inversion in lead 3  ECG (independently read by me, and (: Normal sinus rhythm with mild sinus arrhythmia.  Heart rate 65 bpm.  Normal intervals.  Nondiagnostic T changes in lead 3.  October 2014 ECG: Normal sinus rhythm at 80 beats per minute  LABS: BMP Latest Ref Rng & Units 02/06/2018 07/19/2016 08/13/2014  Glucose 65 - 99 mg/dL 95 86 90  BUN 6 - 20 mg/dL _1 Creatinine 0.76 - 1.27 mg/dL 1.03 0.91 0.83  BUN/Creat Ratio 9 - 20 14 - -  Sodium 134 - 144 mmol/L 141 140 141  Potassium 3.5 - 5.2 mmol/L 4.8 3.9 4.6  Chloride 96 - 106 mmol/L 102 105 103  CO2 20 - 29 mmol/L _2 Calcium 8.7 - 10.2 mg/dL 9.6 9.1 9.4   Hepatic Function Latest Ref Rng & Units 02/05/2019 07/10/2018 02/06/2018  Total Protein 6.0 - 8.5 g/dL 6.8 6.8 7.0  Albumin 4.0 - 5.0 g/dL 4.6 4.3 4.6  AST 0 - 40 IU/L 44(H) 48(H) 43(H)  ALT 0 - 44 IU/L 71(H) 78(H) 76(H)  Alk Phosphatase 39 - 117 IU/L  91 96 105  Total Bilirubin 0.0 - 1.2 mg/dL 1.0 0.4 1.0  Bilirubin, Direct 0.00 - 0.40 mg/dL 0.32 0.14 -   CBC Latest  Ref Rng & Units 02/06/2018 07/19/2016 08/13/2014  WBC 3.4 - 10.8 x10E3/uL 5.3 5.9 5.3  Hemoglobin 13.0 - 17.7 g/dL 16.0 14.8 15.6  Hematocrit 37.5 - 51.0 % 45.8 44.6 45.0  Platelets 150 - 450 x10E3/uL 237 203 234   Lab Results  Component Value Date   MCV 89 02/06/2018   MCV 90.7 07/19/2016   MCV 87.2 08/13/2014   Lab Results  Component Value Date   TSH 1.480 02/06/2018    Lipid Panel     Component Value Date/Time   CHOL 77 (L) 02/05/2019 0831   TRIG 33 02/05/2019 0831   HDL 45 02/05/2019 0831   CHOLHDL 1.7 02/05/2019 0831   CHOLHDL 3.7 09/22/2016 0847   VLDL 16 09/22/2016 0847   LDLCALC 21 02/05/2019 0831     RADIOLOGY: No results found.  IMPRESSION:  1. Familial hypercholesterolemia   2. Family history of early CAD   3. Abnormal LFTs   4. Class 1 obesity due to excess calories without serious comorbidity with body mass index (BMI) of 32.0 to 32.9 in adult   5. 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  He was demonstrated to have cholesterols in excess of 400 prior to age 81 and has been on statin therapy since age 86.  In the past, he had some mild LFT elevation on Lipitor as well as development of myalgias to higher dose Crestor. He was on  Zetia 10 mg and Crestor 20 mg and Repatha was implemented to his medical regimen.  He has demonstrated a dramatic benefit from initiation of Repatha and initial follow-up blood work last year had shown an LDL cholesterol dropping down to 27.  Subsequent lipid studies have shown LDL cholesterols at 30 and 21.  When I last saw him LFTs had increased and I recommended reducing Crestor to 10 mg.  His most recent laboratory now shows a total cholesterol 77 triglycerides 33 HDL 45 and LDL cholesterol 21.  AST is improved to 44 but ALT remains elevated at 71.  I am now recommending he further reduce Crestor to 10 mg every other day.  I  commended him on his 35 pound weight loss.  He is now trying to exercise.  He has been watching his diet more closely.  BMI is improved from 36.1-32.6 today.  10 you to weight loss and exercise was recommended.  We discussed exercise at least 5 days/week for 30 minutes of moderate intensity if at all possible.  He is not having any chest pain.  His ECG remained stable.  3 months he will undergo repeat LFTs and lipid studies.  I will see him in 6 months for reevaluation.   Time spent: 25 minutes Troy Sine, MD, [facility]  03/31/2019 7:14 PM

## 2019-04-16 MED FILL — MONTELUKAST SOD 10 MG TAB: 10 | 30 days supply | Qty: 30 | Fill #4

## 2019-04-16 MED FILL — ROSUVASTATIN CALCIUM 10 MG: 10 | 30 days supply | Qty: 15 | Fill #0

## 2019-04-17 MED FILL — LEVOCETIRIZINE 5 MG TABLET: 5 | 30 days supply | Qty: 30 | Fill #0

## 2019-04-21 ENCOUNTER — Other Ambulatory Visit: Payer: Self-pay

## 2019-04-21 MED ORDER — REPATHA SURECLICK 140 MG/ML ~~LOC~~ SOAJ: 2.0000 "pen " | SUBCUTANEOUS | 11 refills | Status: DC

## 2019-05-13 MED FILL — REPATHA SURECLICK 140 MG/ML: 140 | 28 days supply | Qty: 2 | Fill #1

## 2019-05-19 MED FILL — MONTELUKAST SOD 10 MG TAB: 10 | 30 days supply | Qty: 30 | Fill #0

## 2019-05-19 MED FILL — ROSUVASTATIN CALCIUM 10 MG: 10 | 30 days supply | Qty: 15 | Fill #1

## 2019-05-19 MED FILL — LEVOCETIRIZINE 5 MG TABLET: 5 | 30 days supply | Qty: 30 | Fill #0

## 2019-05-28 ENCOUNTER — Other Ambulatory Visit: Payer: Self-pay | Admitting: Cardiovascular Disease

## 2019-05-28 MED FILL — EZETIMIBE 10 MG TABS: 10 | 30 days supply | Qty: 30 | Fill #0

## 2019-06-23 MED FILL — MONTELUKAST SOD 10 MG TAB: 10 | 30 days supply | Qty: 30 | Fill #1

## 2019-06-23 MED FILL — REPATHA SURECLICK 140 MG/ML: 140 | 28 days supply | Qty: 2 | Fill #2

## 2019-06-23 MED FILL — EZETIMIBE 10 MG TABS: 10 | 30 days supply | Qty: 30 | Fill #1

## 2019-06-23 MED FILL — LEVOCETIRIZINE 5 MG TABLET: 5 | 30 days supply | Qty: 30 | Fill #1

## 2019-07-21 MED FILL — MONTELUKAST SOD 10 MG TAB: 10 | 30 days supply | Qty: 30 | Fill #2

## 2019-07-21 MED FILL — EZETIMIBE 10 MG TABS: 10 | 30 days supply | Qty: 30 | Fill #2

## 2019-07-21 MED FILL — LEVOCETIRIZINE DIHYDROCHLOR: 5 | 30 days supply | Qty: 30 | Fill #2

## 2019-08-04 ENCOUNTER — Other Ambulatory Visit: Payer: Self-pay | Admitting: Cardiovascular Disease

## 2019-08-04 MED FILL — REPATHA SURECLICK 140 MG/ML: 140 | 28 days supply | Qty: 2 | Fill #0

## 2019-08-19 MED FILL — ROSUVASTATIN CALCIUM 10 MG: 10 | 30 days supply | Qty: 15 | Fill #2

## 2019-08-19 MED FILL — MONTELUKAST SOD 10 MG TAB: 10 | 30 days supply | Qty: 30 | Fill #3

## 2019-08-19 MED FILL — EZETIMIBE 10 MG TABS: 10 | 30 days supply | Qty: 30 | Fill #3

## 2019-08-19 MED FILL — LEVOCETIRIZINE 5 MG TABLET: 5 | 30 days supply | Qty: 30 | Fill #3

## 2019-09-02 MED FILL — EPINEPHRINE 0.3 MG AUTO-INJ: 0.3 | 30 days supply | Qty: 2 | Fill #0

## 2019-09-17 LAB — HEPATIC FUNCTION PANEL
ALT: 45 IU/L — ABNORMAL HIGH (ref 0–44)
AST: 32 IU/L (ref 0–40)
Albumin: 4.3 g/dL (ref 4.0–5.0)
Alkaline Phosphatase: 72 IU/L (ref 39–117)
Bilirubin Total: 0.6 mg/dL (ref 0.0–1.2)
Bilirubin, Direct: 0.18 mg/dL (ref 0.00–0.40)
Total Protein: 6.7 g/dL (ref 6.0–8.5)

## 2019-09-17 LAB — LIPID PANEL
Chol/HDL Ratio: 2.1 ratio (ref 0.0–5.0)
Cholesterol, Total: 124 mg/dL (ref 100–199)
HDL: 59 mg/dL (ref >39)
LDL Chol Calc (NIH): 54 mg/dL (ref 0–99)
Triglycerides: 45 mg/dL (ref 0–149)
VLDL Cholesterol Cal: 11 mg/dL (ref 5–40)

## 2019-11-13 ENCOUNTER — Other Ambulatory Visit: Payer: Self-pay | Admitting: Cardiovascular Disease

## 2019-11-20 ENCOUNTER — Other Ambulatory Visit: Payer: Self-pay | Admitting: Cardiovascular Disease

## 2019-11-20 MED ORDER — ROSUVASTATIN CALCIUM 10 MG PO TABS: 10.0000 mg | ORAL_TABLET | ORAL | 11 refills | Status: DC

## 2019-11-20 NOTE — Telephone Encounter (Signed)
New Message   *STAT* If patient is at the pharmacy, call can be transferred to refill team.   1. Which medications need to be refilled? (please list name of each medication and dose if known) rosuvastatin (CRESTOR) 10 MG tablet  2. Which pharmacy/location (including street and city if local pharmacy) is medication to be sent to? [facility] [phone] - [place], [place] - [address]  3. Do they need a 30 day or 90 day supply? 90 day    Patient is transferring medication to pharmacy from [facility].

## 2019-11-20 NOTE — Telephone Encounter (Signed)
Refill for Crestor sent tot pharmacy,

## 2019-12-12 ENCOUNTER — Ambulatory Visit (INDEPENDENT_AMBULATORY_CARE_PROVIDER_SITE_OTHER): Payer: 59 | Admitting: Cardiovascular Disease

## 2019-12-12 ENCOUNTER — Other Ambulatory Visit: Payer: Self-pay

## 2019-12-12 ENCOUNTER — Encounter: Payer: Self-pay | Admitting: Cardiovascular Disease

## 2019-12-12 VITALS — BP 118/86 | HR 63 | Temp 98.2°F | Ht 68.0 in | Wt 242.0 lb

## 2019-12-12 DIAGNOSIS — Z8249 Family history of ischemic heart disease and other diseases of the circulatory system: Secondary | ICD-10-CM | POA: Diagnosis not present

## 2019-12-12 DIAGNOSIS — Z79899 Other long term (current) drug therapy: Secondary | ICD-10-CM | POA: Diagnosis not present

## 2019-12-12 DIAGNOSIS — E7801 Familial hypercholesterolemia: Secondary | ICD-10-CM | POA: Diagnosis not present

## 2019-12-12 DIAGNOSIS — R7989 Other specified abnormal findings of blood chemistry: Secondary | ICD-10-CM

## 2019-12-12 DIAGNOSIS — R945 Abnormal results of liver function studies: Secondary | ICD-10-CM | POA: Diagnosis not present

## 2019-12-12 DIAGNOSIS — E6609 Other obesity due to excess calories: Secondary | ICD-10-CM

## 2019-12-12 DIAGNOSIS — Z6836 Body mass index (BMI) 36.0-36.9, adult: Secondary | ICD-10-CM

## 2019-12-12 NOTE — Progress Notes (Signed)
Patient ID: Jason Osborne, male   DOB: [birth year], 34 y.o.   MRN: [id number]     HPI: Jason Osborne is a 34 y.o. male who presents to the office today for a 9 month cardiology evaluation.  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  He tells me that at age 56 he was first diagnosed as having total cholesterols in excess of 400.  He was evaluated at [facility] and initiated therapy with crystallize vitamin C.  Also was started on what sounds like bile acid sequestrant RV.  He was started on statins at age 1.  He had been on Lipitor but developed some mild LFT elevation. More recently he has  been on lower dose Crestor as well as combination with Zetia. He has strong family history for coronary artery disease in that his father died of myocardial infarction at age 29. Multiple family members  had coronary artery disease as well as significant aortic valve stenosis.  He has had issues with occasional myalgias in the past on Lipitor and on higher dose Crestor.  Over the past year, due to Zetia not being generic at the time and the cost was significant.  He discontinued Zetia and was maintained on only low-dose Crestor.  Blood work from 07/18/2016 revealed a total cholesterol 177, triglycerides 56, HDL 44, and LDL 122.  AST was 40, and ALT was minimally increased at 70.  Bilirubin was normal, as were other electrolytes.  His hemoglobin and hematocrit were stable.  When I saw him in March 2018, he had been off Zetia and had only been taking Crestor at 10 mg.  I recommended reinitiation of Zetia 10 mg and increase Crestor to 20 mg.  Repeat laboratory on this therapy showed improvement but continues to show an LDL cholesterol at 104 with total cholesterol 165, triglycerides 79 and HDL 45.    When I saw him in follow-up in May 2018 he was not a candidate for further titration of Crestor due to LFT elevation.  I initiated the process for Repatha.  I last saw  him in October 2019 at which time he was on Repatha for over year  with excellent LDL reduction.  Laboratory 2 weeks prior to that office visit has shown a total cholesterol at 89, triglycerides 58, HDL 47, and LDL cholesterol at 30.  His LFTs remain mildly increased with an AST of 43 and ALT of 76.  He was contacted by telephone and his Crestor was reduced from 20 mg to 10 mg.  Over the year, he had gained weight.  His peak weight had increased to 242.  He does karate several days per week and most recently he admits to an 11 pound weight loss.  He denies any chest pain palpitations PND orthopnea.    I last saw him in November 2020 and since his prior evaluation in October 2019 he continued to do well.   He has had purposeful weight loss and has lost approximately 35 pounds.  He had undergone recent laboratory on February 05, 2019 which showed a total cholesterol 77, HDL 45, LDL 21 and triglycerides 33.  AST had improved to 44 but ALT remains elevated at 71.  He has been taking Crestor 10 mg daily.  At that time with his ALT remains elevated I recommended changing rosuvastatin to every other day.  Over the past 9 months, he has felt well.  However he had some low back issues and  as result had stopped being very active.  He continues to work in the Therapist, nutritional business.  He admits to weight gain.  He denies chest pain PND orthopnea.  He underwent follow-up laboratory on Sep 17, 2019.  On his reduced dose of rosuvastatin ALT was now 45 and AST was normal at 32.  He continues to be on Repatha every 2 weeks in addition to Zetia and LDL cholesterol was 54 with total cholesterol 124.  He denies chest pain PND orthopnea.  One of his sons has been diagnosed with Long Covid apparently has undergone evaluations for migraine headaches and loss of feeling in his arms.  He presents for evaluation  Past Medical History:  Diagnosis Date  . Hyperlipidemia     Past Surgical History:  Procedure Laterality Date  . HAND  SURGERY Left   . VASECTOMY      No Known Allergies  Current Outpatient Medications  Medication Sig Dispense Refill  . Desloratadine (CLARINEX PO) Take 1 tablet by mouth daily.    Marland Kitchen EPINEPHrine 0.3 mg/0.3 mL IJ SOAJ injection   1  . ezetimibe (ZETIA) 10 MG tablet Take 1 tablet (10 mg total) by mouth daily. 90 tablet 1  . levocetirizine (XYZAL) 5 MG tablet SMARTSIG:1 Tablet(s) By Mouth Every Evening    . montelukast (SINGULAIR) 10 MG tablet Take 10 mg by mouth at bedtime.    . NON FORMULARY Allergy injection    . omeprazole (PRILOSEC) 10 MG capsule Take 10 mg by mouth daily.    Marland Kitchen REPATHA SURECLICK 140 MG/ML SOAJ INJECT 140 MG UNDER THE SKIN EVERY 14 DAYS 2 mL 2  . rosuvastatin (CRESTOR) 10 MG tablet Take 1 tablet (10 mg total) by mouth every other day. 15 tablet 11   No current facility-administered medications for this visit.    Social History   Socioeconomic History  . Marital status: Married    Spouse name: Not on file  . Number of children: Not on file  . Years of education: Not on file  . Highest education level: Not on file  Occupational History  . Not on file  Tobacco Use  . Smoking status: Never Smoker  . Smokeless tobacco: Never Used  Vaping Use  . Vaping Use: Never used  Substance and Sexual Activity  . Alcohol use: No  . Drug use: No  . Sexual activity: Not on file  Other Topics Concern  . Not on file  Social History Narrative  . Not on file   Social Determinants of Health   Financial Resource Strain:   . Difficulty of Paying Living Expenses:   Food Insecurity:   . Worried About Programme researcher, broadcasting/film/video in the Last Year:   . Barista in the Last Year:   Transportation Needs:   . Freight forwarder (Medical):   Marland Kitchen Lack of Transportation (Non-Medical):   Physical Activity:   . Days of Exercise per Week:   . Minutes of Exercise per Session:   Stress:   . Feeling of Stress :   Social Connections:   . Frequency of Communication with Friends and  Family:   . Frequency of Social Gatherings with Friends and Family:   . Attends Religious Services:   . Active Member of Clubs or Organizations:   . Attends Banker Meetings:   Marland Kitchen Marital Status:   Intimate Partner Violence:   . Fear of Current or Ex-Partner:   . Emotionally Abused:   Marland Kitchen Physically  Abused:   . Sexually Abused:     Family History  Problem Relation Age of Onset  . Hypertension Mother   . Heart attack Father 89  . Heart disease Father   . Hyperlipidemia Brother   . Hyperlipidemia Maternal Grandfather   . Hypertension Maternal Grandfather   . Cancer - Lung Maternal Grandfather   . Hyperlipidemia Brother   . Heart disease Paternal Grandfather   . Hyperlipidemia Paternal Grandfather   . Hypertension Paternal Grandfather   . Melanoma Paternal Grandfather    Additional social history is notable that he is married.  He has 3 children. 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  ROS General: Negative; No fevers, chills, or night sweats;  HEENT: Negative; No changes in vision or hearing, sinus congestion, difficulty swallowing Pulmonary: Negative; No cough, wheezing, shortness of breath, hemoptysis Cardiovascular: Negative; No chest pain, presyncope, syncope, palpitations GI: Negative; No nausea, vomiting, diarrhea, or abdominal pain GU: Negative; No dysuria, hematuria, or difficulty voiding Musculoskeletal: Negative; no myalgias, joint pain, or weakness Hematologic/Oncology: Negative; no easy bruising, bleeding Endocrine: Negative; no heat/cold intolerance; no diabetes Neuro: Negative; no changes in balance, headaches Skin: Negative; No rashes or skin lesions Psychiatric: Negative; No behavioral problems, depression Sleep: Negative; No snoring, daytime sleepiness, hypersomnolence, bruxism, restless legs, hypnogognic hallucinations, no cataplexy Other comprehensive 14 point system review is  negative.   PE BP (!) 118/86   Pulse 63   Temp 98.2 F (36.8 C)   Ht '5\' 8"'$  (1.727 m)   Wt (!) 242 lb (109.8 kg)   SpO2 98%   BMI 36.80 kg/m    Repeat blood pressure by me was 120/82  Wt Readings from Last 3 Encounters:  12/12/19 (!) 242 lb (109.8 kg)  03/31/19 214 lb 3.2 oz (97.2 kg)  06/13/18 245 lb 3.2 oz (111.2 kg)   General: Alert, oriented, no distress.  Skin: normal turgor, no rashes, warm and dry HEENT: Normocephalic, atraumatic. Pupils equal round and reactive to light; sclera anicteric; extraocular muscles intact;  Nose without nasal septal hypertrophy Mouth/Parynx benign; Mallinpatti scale 3 Neck: No JVD, no carotid bruits; normal carotid upstroke Lungs: clear to ausculatation and percussion; no wheezing or rales Chest wall: without tenderness to palpitation Heart: PMI not displaced, RRR, s1 s2 normal, 1/6 systolic murmur, no diastolic murmur, no rubs, gallops, thrills, or heaves Abdomen: soft, nontender; no hepatosplenomehaly, BS+; abdominal aorta nontender and not dilated by palpation. Back: no CVA tenderness Pulses 2+ Musculoskeletal: full range of motion, normal strength, no joint deformities Extremities: no clubbing cyanosis or edema, Homan's sign negative  Neurologic: grossly nonfocal; Cranial nerves grossly wnl Psychologic: Normal mood and affect   ECG (independently read by me): Normal sinus rhythm at 63 bpm.  No ectopy.  Normal intervals.  T wave abnormality in lead III.  November 2020 ECG (independently read by me): Normal sinus rhythm at 75 bpm.  Normal intervals.  No ectopy.  October 2019 ECG (independently read by me): Normal sinus rhythm at 61 bpm.  No ectopy.  Normal intervals.  Nondiagnostic T change in lead III.  Sep 26, 2016 ECG (independently read by me): Normal sinus rhythm at 63 bpm.  Nondiagnostic T wave inversion in lead 3.  Normal intervals.  07/20/2016 ECG (independently read by me): Normal sinus rhythm with mild sinus arrhythmia.   Normal intervals.  Non-diagnostic T wave inversion in lead 3  ECG (independently read by me, and (:  Normal sinus rhythm with mild sinus arrhythmia.  Heart rate 65 bpm.  Normal intervals.  Nondiagnostic T changes in lead 3.  October 2014 ECG: Normal sinus rhythm at 80 beats per minute  LABS: BMP Latest Ref Rng & Units 02/06/2018 07/19/2016 08/13/2014  Glucose 65 - 99 mg/dL 95 86 90  BUN 6 - 20 mg/dL '14 14 15  '$ Creatinine 0.76 - 1.27 mg/dL 1.03 0.91 0.83  BUN/Creat Ratio 9 - 20 14 - -  Sodium 134 - 144 mmol/L 141 140 141  Potassium 3.5 - 5.2 mmol/L 4.8 3.9 4.6  Chloride 96 - 106 mmol/L 102 105 103  CO2 20 - 29 mmol/L '24 27 23  '$ Calcium 8.7 - 10.2 mg/dL 9.6 9.1 9.4   Hepatic Function Latest Ref Rng & Units 09/17/2019 02/05/2019 07/10/2018  Total Protein 6.0 - 8.5 g/dL 6.7 6.8 6.8  Albumin 4.0 - 5.0 g/dL 4.3 4.6 4.3  AST 0 - 40 IU/L 32 44(H) 48(H)  ALT 0 - 44 IU/L 45(H) 71(H) 78(H)  Alk Phosphatase 39 - 117 IU/L 72 91 96  Total Bilirubin 0.0 - 1.2 mg/dL 0.6 1.0 0.4  Bilirubin, Direct 0.00 - 0.40 mg/dL 0.18 0.32 0.14   CBC Latest Ref Rng & Units 02/06/2018 07/19/2016 08/13/2014  WBC 3.4 - 10.8 x10E3/uL 5.3 5.9 5.3  Hemoglobin 13.0 - 17.7 g/dL 16.0 14.8 15.6  Hematocrit 37.5 - 51.0 % 45.8 44.6 45.0  Platelets 150 - 450 x10E3/uL 237 203 234   Lab Results  Component Value Date   MCV 89 02/06/2018   MCV 90.7 07/19/2016   MCV 87.2 08/13/2014   Lab Results  Component Value Date   TSH 1.480 02/06/2018    Lipid Panel     Component Value Date/Time   CHOL 124 09/17/2019 0825   TRIG 45 09/17/2019 0825   HDL 59 09/17/2019 0825   CHOLHDL 2.1 09/17/2019 0825   CHOLHDL 3.7 09/22/2016 0847   VLDL 16 09/22/2016 0847   LDLCALC 54 09/17/2019 0825     RADIOLOGY: No results found.  IMPRESSION:  1. Familial hypercholesterolemia   2. Family history of early CAD   3. Abnormal LFTs   4. Medication management   5. 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  He was demonstrated to have cholesterols in excess of 400 prior to age 37 and has been on statin therapy since age 51.  In the past, he had some mild LFT elevation on Lipitor as well as development of myalgias to higher dose Crestor. He was on  Zetia 10 mg and Crestor 20 mg and was started on Repatha injections in 2018.Marland Kitchen  He has demonstrated a dramatic benefit from initiation of Repatha and initial follow-up blood work last year had shown an LDL cholesterol dropping down to 27.  Subsequent lipid studies have shown LDL cholesterols at 30 and 21.  When I last saw him LFTs had increased and I recommended reducing Crestor to 10 mg.  I last saw him in November 2020, laboratory laboratory showed a total cholesterol 77 triglycerides 33 HDL 45 and LDL cholesterol 21.  AST is improved to 44 but ALT remains elevated at 71.  At that time I further reduce rosuvastatin to 10 mg daily.  Over the past 9 months, he has had issues  with low back pain and essentially was not being as active as he had in the past.  His weight today is 242 pounds which is a 28 pound increase since his last evaluation.  However at his last evaluation he had purposeful weight loss and had lost approximately 30 pounds leading to the weight of 214.  Most recent lipid panel shows improvement with normalization of AST at 32 and ALT only minimally increased at 45.  LDL cholesterol has increased from 21-54 with total cholesterol 124.  I have recommended he continue his current regimen with Zetia, rosuvastatin 10 mg every other day in addition to Repatha 140 mg/mL every 2 weeks.  We discussed the importance of exercise and weight loss.  We also discussed the fact that he is not yet had a Covid vaccination.  I provided him with information regarding the benefits of vaccination.  He is still  hesitant.  One of his sons is undergoing treatment and recently had gone up to [place] where he was evaluated at [facility].  In 6 months, I have recommended Jason Osborne undergo follow-up laboratory.  I will see him in 1 year for cardiology reevaluation in the office setting.    I am now recommending he further reduce Crestor to 10 mg every other day.  I commended him on his 35 pound weight loss.  He is now trying to exercise.  He has been watching his diet more closely.  BMI is improved from 36.1-32.6 today.  10 you to weight loss and exercise was recommended.  We discussed exercise at least 5 days/week for 30 minutes of moderate intensity if at all possible.  He is not having any chest pain.  His ECG remained stable.  3 months he will undergo repeat LFTs and lipid studies.  I will see him in 6 months for reevaluation.   Time spent: 25 minutes Troy Sine, MD, [facility]  12/12/2019 9:07 AM

## 2019-12-12 NOTE — Patient Instructions (Signed)
Medication Instructions:  CONTINUE WITH CURRENT MEDICATIONS. NO CHANGES.  *If you need a refill on your cardiac medications before your next appointment, please call your pharmacy*   Lab Work: In 6 months: cmet Cbc tsh Lipid  LFT If you have labs (blood work) drawn today and your tests are completely normal, you will receive your results only by:  MyChart Message (if you have MyChart) OR  A paper copy in the mail If you have any lab test that is abnormal or we need to change your treatment, we will call you to review the results.    Follow-Up: At [facility], you and your health needs are our priority.  As part of our continuing mission to provide you with exceptional heart care, we have created designated Provider Care Teams.  These Care Teams include your primary Cardiologist (physician) and Advanced Practice Providers (APPs -  Physician Assistants and Nurse Practitioners) who all work together to provide you with the care you need, when you need it.  We recommend signing up for the patient portal called "MyChart".  Sign up information is provided on this After Visit Summary.  MyChart is used to connect with patients for Virtual Visits (Telemedicine).  Patients are able to view lab/test results, encounter notes, upcoming appointments, etc.  Non-urgent messages can be sent to your provider as well.   To learn more about what you can do with MyChart, go to [email or web address].    Your next appointment:   12 month(s)  The format for your next appointment:   In Person  Provider:   Nicki Guadalajara, MD

## 2019-12-15 ENCOUNTER — Other Ambulatory Visit: Payer: Self-pay | Admitting: Cardiovascular Disease

## 2020-02-10 ENCOUNTER — Other Ambulatory Visit: Payer: Self-pay | Admitting: Cardiovascular Disease

## 2020-04-10 ENCOUNTER — Telehealth: Payer: Self-pay | Admitting: Unknown Physician Specialty

## 2020-04-10 ENCOUNTER — Ambulatory Visit (HOSPITAL_COMMUNITY)
Admission: RE | Admit: 2020-04-10 | Discharge: 2020-04-10 | Disposition: A | Discharge status: Home / Self Care | Payer: 59 | Source: Ambulatory Visit | Attending: Pulmonary Disease | Admitting: Pulmonary Disease

## 2020-04-10 ENCOUNTER — Other Ambulatory Visit: Payer: Self-pay | Admitting: Unknown Physician Specialty

## 2020-04-10 DIAGNOSIS — E669 Obesity, unspecified: Secondary | ICD-10-CM

## 2020-04-10 DIAGNOSIS — U071 COVID-19: Secondary | ICD-10-CM | POA: Diagnosis present

## 2020-04-10 MED ORDER — DIPHENHYDRAMINE HCL 50 MG/ML IJ SOLN: 50.0000 mg | Freq: Once | INTRAMUSCULAR | Status: DC | PRN

## 2020-04-10 MED ORDER — SODIUM CHLORIDE 0.9 % IV SOLN: INTRAVENOUS | Status: DC | PRN

## 2020-04-10 MED ORDER — SOTROVIMAB 500 MG/8ML IV SOLN
500.0000 mg | Freq: Once | INTRAVENOUS | Status: AC
  Administered 2020-04-10: 500 mg via INTRAVENOUS

## 2020-04-10 MED ORDER — ALBUTEROL SULFATE HFA 108 (90 BASE) MCG/ACT IN AERS: 2.0000 | INHALATION_SPRAY | Freq: Once | RESPIRATORY_TRACT | Status: DC | PRN

## 2020-04-10 MED ORDER — FAMOTIDINE IN NACL 20-0.9 MG/50ML-% IV SOLN: 20.0000 mg | Freq: Once | INTRAVENOUS | Status: DC | PRN

## 2020-04-10 MED ORDER — METHYLPREDNISOLONE SODIUM SUCC 125 MG IJ SOLR: 125.0000 mg | Freq: Once | INTRAMUSCULAR | Status: DC | PRN

## 2020-04-10 MED ORDER — EPINEPHRINE 0.3 MG/0.3ML IJ SOAJ: 0.3000 mg | Freq: Once | INTRAMUSCULAR | Status: DC | PRN

## 2020-04-10 NOTE — Progress Notes (Signed)
Patient reviewed Fact Sheet for Patients, Parents, and Caregivers for Emergency Use Authorization (EUA) of Sotrovimab for the Treatment of Coronavirus. Patient also reviewed and is agreeable to the estimated cost of treatment. Patient is agreeable to proceed.   

## 2020-04-10 NOTE — Discharge Instructions (Signed)

## 2020-04-10 NOTE — Progress Notes (Signed)
Diagnosis: COVID-19  Physician: Dr. Patrick Wright  Procedure: Covid Infusion Clinic Med: Sotrovimab infusion - Provided patient with sotrovimab fact sheet for patients, parents, and caregivers prior to infusion.   Complications: No immediate complications noted  Discharge: Discharged home    

## 2020-04-10 NOTE — Telephone Encounter (Signed)
I connected by phone with Jason Osborne on 04/10/2020 at 10:03 AM to discuss the potential use of a new treatment for mild to moderate COVID-19 viral infection in non-hospitalized patients.  This patient is a 34 y.o. male that meets the FDA criteria for Emergency Use Authorization of COVID monoclonal antibody casirivimab/imdevimab, bamlanivimab/eteseviamb, or sotrovimab.  Has a (+) direct SARS-CoV-2 viral test result  Has mild or moderate COVID-19   Is NOT hospitalized due to COVID-19  Is within 10 days of symptom onset  Has at least one of the high risk factor(s) for progression to severe COVID-19 and/or hospitalization as defined in EUA.  Specific high risk criteria : BMI > 25   I have spoken and communicated the following to the patient or parent/caregiver regarding COVID monoclonal antibody treatment:  1. FDA has authorized the emergency use for the treatment of mild to moderate COVID-19 in adults and pediatric patients with positive results of direct SARS-CoV-2 viral testing who are 24 years of age and older weighing at least 40 kg, and who are at high risk for progressing to severe COVID-19 and/or hospitalization.  2. The significant known and potential risks and benefits of COVID monoclonal antibody, and the extent to which such potential risks and benefits are unknown.  3. Information on available alternative treatments and the risks and benefits of those alternatives, including clinical trials.  4. Patients treated with COVID monoclonal antibody should continue to self-isolate and use infection control measures (e.g., wear mask, isolate, social distance, avoid sharing personal items, clean and disinfect "high touch" surfaces, and frequent handwashing) according to CDC guidelines.   5. The patient or parent/caregiver has the option to accept or refuse COVID monoclonal antibody treatment.  After reviewing this information with the patient, the patient has agreed to receive one of  the available covid 19 monoclonal antibodies and will be provided an appropriate fact sheet prior to infusion. Gabriel Cirri, NP 04/10/2020 10:03 AM  Sx onset 11/22

## 2020-06-09 ENCOUNTER — Other Ambulatory Visit: Payer: Self-pay | Admitting: Cardiovascular Disease

## 2020-10-30 ENCOUNTER — Other Ambulatory Visit: Payer: Self-pay | Admitting: Cardiovascular Disease

## 2020-12-25 ENCOUNTER — Other Ambulatory Visit: Payer: Self-pay | Admitting: Cardiovascular Disease

## 2021-01-10 ENCOUNTER — Telehealth: Payer: Self-pay | Admitting: Cardiovascular Disease

## 2021-01-10 MED ORDER — EZETIMIBE 10 MG PO TABS
10.0000 mg | ORAL_TABLET | Freq: Every day | ORAL | 1 refills | Status: DC
Start: 2021-01-10 — End: 2021-06-29

## 2021-01-10 NOTE — Telephone Encounter (Signed)
*  STAT* If patient is at the pharmacy, call can be transferred to refill team.   1. Which medications need to be refilled? (please list name of each medication and dose if known)  ezetimibe (ZETIA) 10 MG tablet  2. Which pharmacy/location (including street and city if local pharmacy) is medication to be sent to? [facility] [phone] - [place], [place] - [address]  3. Do they need a 30 day or 90 day supply? 90 day supply   Pt is completely out of the Zetia 10mg

## 2021-01-10 NOTE — Addendum Note (Signed)
Addended by: Hedda Slade on: 01/10/2021 11:14 AM   Modules accepted: Orders

## 2021-01-10 NOTE — Telephone Encounter (Signed)
*  STAT* If patient is at the pharmacy, call can be transferred to refill team.   1. Which medications need to be refilled? (please list name of each medication and dose if known) rosuvastatin (CRESTOR) 10 MG tablet  2. Which pharmacy/location (including street and city if local pharmacy) is medication to be sent to? [facility] [phone] - [place], [place] - [address]  3. Do they need a 30 day or 90 day supply? 90 day supply

## 2021-03-04 ENCOUNTER — Other Ambulatory Visit: Payer: Self-pay | Admitting: Cardiovascular Disease

## 2021-03-04 DIAGNOSIS — E7801 Familial hypercholesterolemia: Secondary | ICD-10-CM

## 2021-04-15 ENCOUNTER — Other Ambulatory Visit: Payer: Self-pay

## 2021-04-15 DIAGNOSIS — Z79899 Other long term (current) drug therapy: Secondary | ICD-10-CM

## 2021-04-15 LAB — COMPREHENSIVE METABOLIC PANEL
ALT: 47 IU/L — ABNORMAL HIGH (ref 0–44)
AST: 34 IU/L (ref 0–40)
Albumin/Globulin Ratio: 1.6 (ref 1.2–2.2)
Albumin: 4.6 g/dL (ref 4.0–5.0)
Alkaline Phosphatase: 95 IU/L (ref 44–121)
BUN/Creatinine Ratio: 18 (ref 9–20)
BUN: 18 mg/dL (ref 6–20)
Bilirubin Total: 0.9 mg/dL (ref 0.0–1.2)
CO2: 24 mmol/L (ref 20–29)
Calcium: 9.7 mg/dL (ref 8.7–10.2)
Chloride: 102 mmol/L (ref 96–106)
Creatinine, Ser: 1.02 mg/dL (ref 0.76–1.27)
Globulin, Total: 2.9 g/dL (ref 1.5–4.5)
Glucose: 90 mg/dL (ref 70–99)
Potassium: 4.4 mmol/L (ref 3.5–5.2)
Sodium: 138 mmol/L (ref 134–144)
Total Protein: 7.5 g/dL (ref 6.0–8.5)
eGFR: 98 mL/min/{1.73_m2} (ref >59)

## 2021-04-15 LAB — CBC
Hematocrit: 47.2 % (ref 37.5–51.0)
Hemoglobin: 16.1 g/dL (ref 13.0–17.7)
MCH: 30.4 pg (ref 26.6–33.0)
MCHC: 34.1 g/dL (ref 31.5–35.7)
MCV: 89 fL (ref 79–97)
Platelets: 220 10*3/uL (ref 150–450)
RBC: 5.29 x10E6/uL (ref 4.14–5.80)
RDW: 12.9 % (ref 11.6–15.4)
WBC: 6.8 10*3/uL (ref 3.4–10.8)

## 2021-04-15 LAB — LIPID PANEL
Chol/HDL Ratio: 2.7 ratio (ref 0.0–5.0)
Cholesterol, Total: 149 mg/dL (ref 100–199)
HDL: 56 mg/dL (ref >39)
LDL Chol Calc (NIH): 82 mg/dL (ref 0–99)
Triglycerides: 52 mg/dL (ref 0–149)
VLDL Cholesterol Cal: 11 mg/dL (ref 5–40)

## 2021-04-15 LAB — TSH: TSH: 1.14 u[IU]/mL (ref 0.450–4.500)

## 2021-04-15 LAB — HEPATIC FUNCTION PANEL: Bilirubin, Direct: 0.25 mg/dL (ref 0.00–0.40)

## 2021-04-19 ENCOUNTER — Telehealth: Payer: Self-pay | Admitting: Cardiovascular Disease

## 2021-04-19 DIAGNOSIS — E7801 Familial hypercholesterolemia: Secondary | ICD-10-CM

## 2021-04-19 NOTE — Telephone Encounter (Signed)
Patient returning call for lab results. 

## 2021-04-19 NOTE — Telephone Encounter (Signed)
Patient made aware of results and verbalized understanding.  Repeat lab orders placed.   [name], MD  04/18/2021 12:03 PM EST     Chemistry essentially normal with minimal ALT elevation ,not significant; CBC stable; TSH normal.  Lipid studies have mildly increased.  Patient is on Zetia in addition to Repatha injection every 2 weeks.  Aim for LDL less than 70.  If continues to be elevated may need to add rosuvastatin 5 mg weekly but recommend rechecking LFTs in lipid panel in 4 months.

## 2021-06-09 ENCOUNTER — Encounter: Payer: Self-pay | Admitting: Cardiovascular Disease

## 2021-06-09 ENCOUNTER — Other Ambulatory Visit: Payer: Self-pay

## 2021-06-09 ENCOUNTER — Ambulatory Visit (INDEPENDENT_AMBULATORY_CARE_PROVIDER_SITE_OTHER): Payer: 59 | Admitting: Cardiovascular Disease

## 2021-06-09 VITALS — BP 124/78 | HR 77 | Ht 68.0 in | Wt 245.0 lb

## 2021-06-09 DIAGNOSIS — E7801 Familial hypercholesterolemia: Secondary | ICD-10-CM

## 2021-06-09 DIAGNOSIS — Z8249 Family history of ischemic heart disease and other diseases of the circulatory system: Secondary | ICD-10-CM | POA: Diagnosis not present

## 2021-06-09 DIAGNOSIS — E6609 Other obesity due to excess calories: Secondary | ICD-10-CM

## 2021-06-09 DIAGNOSIS — Z79899 Other long term (current) drug therapy: Secondary | ICD-10-CM

## 2021-06-09 DIAGNOSIS — Z6836 Body mass index (BMI) 36.0-36.9, adult: Secondary | ICD-10-CM

## 2021-06-09 DIAGNOSIS — R7989 Other specified abnormal findings of blood chemistry: Secondary | ICD-10-CM

## 2021-06-09 NOTE — Progress Notes (Signed)
Patient ID: Jason Osborne, male   DOB: [birth year], 36 y.o.   MRN: [id number]     HPI: Jason Osborne is a 36 y.o. male who presents to the office today for an 85 month cardiology evaluation.  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  He tells me that at age 53 he was first diagnosed as having total cholesterols in excess of 400 and believes his value was 485.  At that time, his father had died with a myocardial infarction at age 62 and he was 36 years old and it was recommended that children have their cholesterol levels checked.  He was evaluated at [facility] and initiated therapy with crystallize vitamin C.  Also was started on what sounds like bile acid sequestrant.  He was started on statins at age 67.  He had been on Lipitor but developed some mild LFT elevation. More recently he has  been on lower dose Crestor as well as combination with Zetia. He has strong family history for coronary artery disease in that his father died of myocardial infarction at age 66. Multiple family members  had coronary artery disease as well as significant aortic valve stenosis.  He has had issues with occasional myalgias in the past on Lipitor and on higher dose Crestor.  Over the past year, due to Zetia not being generic at the time and the cost was significant.  He discontinued Zetia and was maintained on only low-dose Crestor.  Blood work from 07/18/2016 revealed a total cholesterol 177, triglycerides 56, HDL 44, and LDL 122.  AST was 40, and ALT was minimally increased at 70.  Bilirubin was normal, as were other electrolytes.  His hemoglobin and hematocrit were stable.  When I saw him in March 2018, he had been off Zetia and had only been taking Crestor at 10 mg.  I recommended reinitiation of Zetia 10 mg and increase Crestor to 20 mg.  Repeat laboratory on this therapy showed improvement but continues to show an LDL cholesterol at 104 with total cholesterol 165,  triglycerides 79 and HDL 45.    When I saw him in follow-up in May 2018 he was not a candidate for further titration of Crestor due to LFT elevation.  I initiated the process for Repatha.  I last saw him in October 2019 at which time he was on Repatha for over year  with excellent LDL reduction.  Laboratory 2 weeks prior to that office visit has shown a total cholesterol at 89, triglycerides 58, HDL 47, and LDL cholesterol at 30.  His LFTs remain mildly increased with an AST of 43 and ALT of 76.  He was contacted by telephone and his Crestor was reduced from 20 mg to 10 mg.  Over the year, he had gained weight.  His peak weight had increased to 242.  He does karate several days per week and most recently he admits to an 11 pound weight loss.  He denies any chest pain palpitations PND orthopnea.    I saw him in November 2020 and since his prior evaluation in October 2019 he continued to do well.   He has had purposeful weight loss and has lost approximately 35 pounds.  He had undergone recent laboratory on February 05, 2019 which showed a total cholesterol 77, HDL 45, LDL 21 and triglycerides 33.  AST had improved to 44 but ALT remains elevated at 71.  He has been taking Crestor 10 mg daily.  At that time with his ALT remains elevated I recommended changing rosuvastatin to every other day.  I last saw him on December 12, 2019.  Since his prior evaluation he had felt well.  However he had some back issues which resulted in him not being very active.  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 He admits to weight gain.  He denies chest pain PND orthopnea.  He underwent follow-up laboratory on Sep 17, 2019.  On his reduced dose of rosuvastatin ALT was now 45 and AST was normal at 32.  He continues to be on Repatha every 2 weeks in addition to Zetia and LDL cholesterol was 54 with total cholesterol 124.  He denies chest pain PND orthopnea.  One of his sons has been diagnosed with Long Covid apparently has  undergone evaluations for migraine headaches and loss of feeling in his arms.    Since I last saw him, he has not been successful with weight loss.  His children are now 38, 87 and 8 and all of had COVID 1 had subsequent grand mal seizures and his other son had migraine issues.  He has continued to be on Zetia 10 mg and has been taking rosuvastatin 10 mg every other day due to mild liver function elevation.  He does not drink alcohol.  He continues to be on Repatha 140 mg injection every 14 days.  He underwent recent laboratory on April 15, 2021.  This now shows slight slight increase in his LDL cholesterol from 54 in May 2000 21-82 on April 15, 2021.  He was very mild ALT elevation at 47 with upper normal at 44.  AST was normal at 34.  He denies any chest pain.  He has not been active and has not been exercising.  He presents for evaluation Past Medical History:  Diagnosis Date   Hyperlipidemia     Past Surgical History:  Procedure Laterality Date   HAND SURGERY Left    VASECTOMY      No Known Allergies  Current Outpatient Medications  Medication Sig Dispense Refill   Desloratadine (CLARINEX PO) Take 1 tablet by mouth daily.     EPINEPHrine 0.3 mg/0.3 mL IJ SOAJ injection   1   Evolocumab (REPATHA SURECLICK) 314 MG/ML SOAJ INJECT 140 MG UNDER THE SKIN EVERY 14 DAYS 6 mL 0   ezetimibe (ZETIA) 10 MG tablet Take 1 tablet (10 mg total) by mouth daily. 90 tablet 1   levocetirizine (XYZAL) 5 MG tablet SMARTSIG:1 Tablet(s) By Mouth Every Evening     montelukast (SINGULAIR) 10 MG tablet Take 10 mg by mouth at bedtime.     NON FORMULARY Allergy injection     omeprazole (PRILOSEC) 10 MG capsule Take 10 mg by mouth daily.     rosuvastatin (CRESTOR) 10 MG tablet TAKE 1 TABLET(10 MG) BY MOUTH EVERY OTHER DAY 15 tablet 11   No current facility-administered medications for this visit.    Social History   Socioeconomic History   Marital status: Married    Spouse name: Not on file   Number of  children: Not on file   Years of education: Not on file   Highest education level: Not on file  Occupational History   Not on file  Tobacco Use   Smoking status: Never   Smokeless tobacco: Never  Vaping Use   Vaping Use: Never used  Substance and Sexual Activity   Alcohol use: No   Drug use: No   Sexual  activity: Not on file  Other Topics Concern   Not on file  Social History Narrative   Not on file   Social Determinants of Health   Financial Resource Strain: Not on file  Food Insecurity: Not on file  Transportation Needs: Not on file  Physical Activity: Not on file  Stress: Not on file  Social Connections: Not on file  Intimate Partner Violence: Not on file    Family History  Problem Relation Age of Onset   Hypertension Mother    Heart attack Father 52   Heart disease Father    Hyperlipidemia Brother    Hyperlipidemia Maternal Grandfather    Hypertension Maternal Grandfather    Cancer - Lung Maternal Grandfather    Hyperlipidemia Brother    Heart disease Paternal Grandfather    Hyperlipidemia Paternal Grandfather    Hypertension Paternal Grandfather    Melanoma Paternal Grandfather    Additional social history is notable that he is married.  He has 3 children. 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  ROS General: Negative; No fevers, chills, or night sweats;  HEENT: Negative; No changes in vision or hearing, sinus congestion, difficulty swallowing Pulmonary: Negative; No cough, wheezing, shortness of breath, hemoptysis Cardiovascular: Negative; No chest pain, presyncope, syncope, palpitations GI: Negative; No nausea, vomiting, diarrhea, or abdominal pain GU: Negative; No dysuria, hematuria, or difficulty voiding Musculoskeletal: Negative; no myalgias, joint pain, or weakness Hematologic/Oncology: Negative; no easy bruising, bleeding Endocrine: Negative; no heat/cold intolerance; no  diabetes Neuro: Negative; no changes in balance, headaches Skin: Negative; No rashes or skin lesions Psychiatric: Negative; No behavioral problems, depression Sleep: Negative; No snoring, daytime sleepiness, hypersomnolence, bruxism, restless legs, hypnogognic hallucinations, no cataplexy Other comprehensive 14 point system review is negative.   PE BP 124/78    Pulse 77    Ht $R'5\' 8"'Iv$  (1.727 m)    Wt 245 lb (111.1 kg)    SpO2 99%    BMI 37.25 kg/m    Repeat blood pressure by me was 120/82  Wt Readings from Last 3 Encounters:  06/09/21 245 lb (111.1 kg)  12/12/19 (!) 242 lb (109.8 kg)  03/31/19 214 lb 3.2 oz (97.2 kg)     Physical Exam BP 124/78    Pulse 77    Ht $R'5\' 8"'mt$  (1.727 m)    Wt 245 lb (111.1 kg)    SpO2 99%    BMI 37.25 kg/m  General: Alert, oriented, no distress.  Skin: normal turgor, no rashes, warm and dry HEENT: Normocephalic, atraumatic. Pupils equal round and reactive to light; sclera anicteric; extraocular muscles intact; Fundi ** Nose without nasal septal hypertrophy Mouth/Parynx benign; Mallinpatti scale Neck: No JVD, no carotid bruits; normal carotid upstroke Lungs: clear to ausculatation and percussion; no wheezing or rales Chest wall: without tenderness to palpitation Heart: PMI not displaced, RRR, s1 s2 normal, 1/6 systolic murmur, no diastolic murmur, no rubs, gallops, thrills, or heaves Abdomen: soft, nontender; no hepatosplenomehaly, BS+; abdominal aorta nontender and not dilated by palpation. Back: no CVA tenderness Pulses 2+ Musculoskeletal: full range of motion, normal strength, no joint deformities Extremities: no clubbing cyanosis or edema, Homan's sign negative  Neurologic: grossly nonfocal; Cranial nerves grossly wnl Psychologic: Normal mood and affect   June 09, 2021 ECG (independently read by me):  NSR at 77 with mid sinus arrythmia  December 12, 2019 ECG (independently read by me): Normal sinus rhythm at 63 bpm.  No  ectopy.  Normal intervals.  T  wave abnormality in lead III.  November 2020 ECG (independently read by me): Normal sinus rhythm at 75 bpm.  Normal intervals.  No ectopy.  October 2019 ECG (independently read by me): Normal sinus rhythm at 61 bpm.  No ectopy.  Normal intervals.  Nondiagnostic T change in lead III.  Sep 26, 2016 ECG (independently read by me): Normal sinus rhythm at 63 bpm.  Nondiagnostic T wave inversion in lead 3.  Normal intervals.  07/20/2016 ECG (independently read by me): Normal sinus rhythm with mild sinus arrhythmia.  Normal intervals.  Non-diagnostic T wave inversion in lead 3  ECG (independently read by me, and (: Normal sinus rhythm with mild sinus arrhythmia.  Heart rate 65 bpm.  Normal intervals.  Nondiagnostic T changes in lead 3.  October 2014 ECG: Normal sinus rhythm at 80 beats per minute  LABS: BMP Latest Ref Rng & Units 04/15/2021 02/06/2018 07/19/2016  Glucose 70 - 99 mg/dL 90 95 86  BUN 6 - 20 mg/dL $Remove'18 14 14  'MOrkueJ$ Creatinine 0.76 - 1.27 mg/dL 1.02 1.03 0.91  BUN/Creat Ratio 9 - $R'20 18 14 'EB$ -  Sodium 134 - 144 mmol/L 138 141 140  Potassium 3.5 - 5.2 mmol/L 4.4 4.8 3.9  Chloride 96 - 106 mmol/L 102 102 105  CO2 20 - 29 mmol/L $RemoveB'24 24 27  'kPQOxOGD$ Calcium 8.7 - 10.2 mg/dL 9.7 9.6 9.1   Hepatic Function Latest Ref Rng & Units 04/15/2021 09/17/2019 02/05/2019  Total Protein 6.0 - 8.5 g/dL 7.5 6.7 6.8  Albumin 4.0 - 5.0 g/dL 4.6 4.3 4.6  AST 0 - 40 IU/L 34 32 44(H)  ALT 0 - 44 IU/L 47(H) 45(H) 71(H)  Alk Phosphatase 44 - 121 IU/L 95 72 91  Total Bilirubin 0.0 - 1.2 mg/dL 0.9 0.6 1.0  Bilirubin, Direct 0.00 - 0.40 mg/dL 0.25 0.18 0.32   CBC Latest Ref Rng & Units 04/15/2021 02/06/2018 07/19/2016  WBC 3.4 - 10.8 x10E3/uL 6.8 5.3 5.9  Hemoglobin 13.0 - 17.7 g/dL 16.1 16.0 14.8  Hematocrit 37.5 - 51.0 % 47.2 45.8 44.6  Platelets 150 - 450 x10E3/uL 220 237 203   Lab Results  Component Value Date   MCV 89 04/15/2021   MCV 89 02/06/2018   MCV 90.7 07/19/2016   Lab Results  Component Value Date   TSH 1.140  04/15/2021    Lipid Panel     Component Value Date/Time   CHOL 149 04/15/2021 0905   TRIG 52 04/15/2021 0905   HDL 56 04/15/2021 0905   CHOLHDL 2.7 04/15/2021 0905   CHOLHDL 3.7 09/22/2016 0847   VLDL 16 09/22/2016 0847   LDLCALC 82 04/15/2021 0905     RADIOLOGY: No results found.  IMPRESSION:  1. Familial hypercholesterolemia   2. Abnormal LFTs   3. Family history of early CAD   31. Medication management   5. Class 2 obesity due to excess calories without serious comorbidity with body mass index (BMI) of 36.0 to 36.9 in adult     ASSESSMENT AND PLAN: Mr. 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  He was demonstrated to have cholesterols in excess of 400 prior to age 16 and has been on statin therapy since age 50.  In the past, he had some mild LFT elevation on Lipitor as well as development of myalgias to higher dose Crestor. He was on  Zetia 10 mg and Crestor 20 mg and was started  on Repatha injections in 2018.Marland Kitchen  He has demonstrated a dramatic benefit from initiation of Repatha and initial follow-up blood work had shown an LDL cholesterol dropping down to 27.  Subsequent lipid studies have shown LDL cholesterols at 30 and 21.  When I  saw him LFTs had increased and I recommended reducing Crestor to 10 mg.  I last saw him in November 2020, laboratory laboratory showed a total cholesterol 77 triglycerides 33 HDL 45 and LDL cholesterol 21.  AST is improved to 44 but ALT remains elevated at 71.  At that time I further reduce rosuvastatin to 10 mg daily.  When evaluated in July 2021 over the past 9 months, he has had issues with low back pain and essentially was not being as active as he had in the past.  His weight today had increased.  Continue to have minimally elevated LFTs and his rosuvastatin dose was reduced to 10 mg every other day.  At his last evaluation he has not been successful with weight loss.  Weight today is 245  pounds with a BMI of 37.25.  Discussed with him improve diet.  He typically makes his lunch and has a ham sandwich for lunch.  Occasionally he does have fried foods but does have fair amount of chicken and occasional fish and Posta.  I have recommended rechecking laboratory in several months.  I am also recommending that we schedule him to undergo coronary calcium score for baseline assessment of coronary atherosclerosis.  We will monitor his LFTs and if his he is able to reduce weight perhaps statin therapy can be further increased.  I will see him in June 2023 for follow-up evaluation or sooner as needed.   Troy Sine, MD, [facility]  06/09/2021 6:30 PM

## 2021-06-09 NOTE — Patient Instructions (Addendum)
Medication Instructions:  Your physician recommends that you continue on your current medications as directed. Please refer to the Current Medication list given to you today.  *If you need a refill on your cardiac medications before your next appointment, please call your pharmacy*   Lab Work: FASTING lab work in June 2023 - lipid panel, LP(a), CMET, CBC, A1c, TSH  If you have labs (blood work) drawn today and your tests are completely normal, you will receive your results only by: [place] (if you have MyChart) OR A paper copy in the mail If you have any lab test that is abnormal or we need to change your treatment, we will call you to review the results.   Testing/Procedures: Dr. Claiborne Billings has ordered a CT coronary calcium score.   Test locations:  [place] ([address] [place], [place] [address]) [facility] ([address] [place], [place] [address])   This is $99 out of pocket.   Coronary CalciumScan A coronary calcium scan is an imaging test used to look for deposits of calcium and other fatty materials (plaques) in the inner lining of the blood vessels of the heart (coronary arteries). These deposits of calcium and plaques can partly clog and narrow the coronary arteries without producing any symptoms or warning signs. This puts a person at risk for a heart attack. This test can detect these deposits before symptoms develop. Tell a health care provider about: Any allergies you have. All medicines you are taking, including vitamins, herbs, eye drops, creams, and over-the-counter medicines. Any problems you or family members have had with anesthetic medicines. Any blood disorders you have. Any surgeries you have had. Any medical conditions you have. Whether you are pregnant or may be pregnant. What are the risks? Generally, this is a safe procedure. However, problems may occur, including: Harm to a pregnant woman and her unborn baby. This test  involves the use of radiation. Radiation exposure can be dangerous to a pregnant woman and her unborn baby. If you are pregnant, you generally should not have this procedure done. Slight increase in the risk of cancer. This is because of the radiation involved in the test. What happens before the procedure? No preparation is needed for this procedure. What happens during the procedure? You will undress and remove any jewelry around your neck or chest. You will put on a hospital gown. Sticky electrodes will be placed on your chest. The electrodes will be connected to an electrocardiogram (ECG) machine to record a tracing of the electrical activity of your heart. A CT scanner will take pictures of your heart. During this time, you will be asked to lie still and hold your breath for 2-3 seconds while a picture of your heart is being taken. The procedure may vary among health care providers and hospitals. What happens after the procedure? You can get dressed. You can return to your normal activities. It is up to you to get the results of your test. Ask your health care provider, or the department that is doing the test, when your results will be ready. Summary A coronary calcium scan is an imaging test used to look for deposits of calcium and other fatty materials (plaques) in the inner lining of the blood vessels of the heart (coronary arteries). Generally, this is a safe procedure. Tell your health care provider if you are pregnant or may be pregnant. No preparation is needed for this procedure. A CT scanner will take pictures of your heart. You  can return to your normal activities after the scan is done. This information is not intended to replace advice given to you by your health care provider. Make sure you discuss any questions you have with your health care provider. Document Released: 10/28/2007 Document Revised: 03/20/2016 Document Reviewed: 03/20/2016 Elsevier Interactive Patient  Education  2017 [place]: At [facility], you and your health needs are our priority.  As part of our continuing mission to provide you with exceptional heart care, we have created designated Provider Care Teams.  These Care Teams include your primary Cardiologist (physician) and Advanced Practice Providers (APPs -  Physician Assistants and Nurse Practitioners) who all work together to provide you with the care you need, when you need it.  We recommend signing up for the patient portal called "MyChart".  Sign up information is provided on this After Visit Summary.  MyChart is used to connect with patients for Virtual Visits (Telemedicine).  Patients are able to view lab/test results, encounter notes, upcoming appointments, etc.  Non-urgent messages can be sent to your provider as well.   To learn more about what you can do with MyChart, go to [email or web address].    Your next appointment:   June 2023 with Dr. Claiborne Billings -- complete lab work before this visit

## 2021-06-29 ENCOUNTER — Other Ambulatory Visit: Payer: Self-pay

## 2021-06-29 MED ORDER — EZETIMIBE 10 MG PO TABS: 10.0000 mg | ORAL_TABLET | Freq: Every day | ORAL | 2 refills | Status: DC

## 2021-07-25 ENCOUNTER — Other Ambulatory Visit: Payer: Self-pay | Admitting: Cardiovascular Disease

## 2021-07-25 DIAGNOSIS — E7801 Familial hypercholesterolemia: Secondary | ICD-10-CM

## 2021-07-26 ENCOUNTER — Ambulatory Visit (INDEPENDENT_AMBULATORY_CARE_PROVIDER_SITE_OTHER)
Admission: RE | Admit: 2021-07-26 | Discharge: 2021-07-26 | Disposition: A | Discharge status: Home / Self Care | Payer: Self-pay | Source: Ambulatory Visit | Attending: Cardiovascular Disease | Admitting: Cardiovascular Disease

## 2021-07-26 ENCOUNTER — Other Ambulatory Visit: Payer: Self-pay

## 2021-07-26 DIAGNOSIS — E7801 Familial hypercholesterolemia: Secondary | ICD-10-CM

## 2021-08-22 ENCOUNTER — Other Ambulatory Visit: Payer: Self-pay

## 2021-08-22 ENCOUNTER — Telehealth: Payer: Self-pay | Admitting: Cardiovascular Disease

## 2021-08-22 DIAGNOSIS — E7801 Familial hypercholesterolemia: Secondary | ICD-10-CM

## 2021-08-22 NOTE — Telephone Encounter (Signed)
Pt states that he is wanting to go to a [facility] to have blood work done being that it is closer to his house. Pt states that he lost is paperwork for his labs and wants to know if they can be emailed to him. Please advise ?

## 2021-08-22 NOTE — Telephone Encounter (Signed)
Spoke to patient advised he can go to any [facility] to have blood work done.Advised they will be able to see order in your chart. ?

## 2021-08-25 ENCOUNTER — Telehealth: Payer: Self-pay

## 2021-08-25 DIAGNOSIS — E7801 Familial hypercholesterolemia: Secondary | ICD-10-CM

## 2021-08-25 DIAGNOSIS — Z79899 Other long term (current) drug therapy: Secondary | ICD-10-CM

## 2021-08-25 NOTE — Telephone Encounter (Signed)
Received call from [facility] @ [facility] she states that she has 2 lab orders from pt and there is duplicate Lipid panel on both. She states that if she does both the pt ill be double billed and she cannot take verbal order to not do one of the she "cannot modify orders" new order entered. Informed [place] Client to check in a minute to get new order. Verbalized understanding. New LFT order entered, visualized active order in EPIC system. ?

## 2021-08-26 ENCOUNTER — Other Ambulatory Visit: Payer: Self-pay

## 2021-08-26 DIAGNOSIS — Z79899 Other long term (current) drug therapy: Secondary | ICD-10-CM

## 2021-08-26 DIAGNOSIS — R7989 Other specified abnormal findings of blood chemistry: Secondary | ICD-10-CM

## 2021-08-26 LAB — COMPREHENSIVE METABOLIC PANEL
ALT: 53 IU/L — ABNORMAL HIGH (ref 0–44)
AST: 35 IU/L (ref 0–40)
Albumin/Globulin Ratio: 2 (ref 1.2–2.2)
Albumin: 4.5 g/dL (ref 4.0–5.0)
Alkaline Phosphatase: 91 IU/L (ref 44–121)
BUN/Creatinine Ratio: 12 (ref 9–20)
BUN: 12 mg/dL (ref 6–20)
Bilirubin Total: 1 mg/dL (ref 0.0–1.2)
CO2: 25 mmol/L (ref 20–29)
Calcium: 9.5 mg/dL (ref 8.7–10.2)
Chloride: 105 mmol/L (ref 96–106)
Creatinine, Ser: 0.97 mg/dL (ref 0.76–1.27)
Globulin, Total: 2.3 g/dL (ref 1.5–4.5)
Glucose: 100 mg/dL — ABNORMAL HIGH (ref 70–99)
Potassium: 4.2 mmol/L (ref 3.5–5.2)
Sodium: 141 mmol/L (ref 134–144)
Total Protein: 6.8 g/dL (ref 6.0–8.5)
eGFR: 104 mL/min/{1.73_m2} (ref >59)

## 2021-08-26 LAB — CBC
Hematocrit: 45.2 % (ref 37.5–51.0)
Hemoglobin: 15.5 g/dL (ref 13.0–17.7)
MCH: 30.2 pg (ref 26.6–33.0)
MCHC: 34.3 g/dL (ref 31.5–35.7)
MCV: 88 fL (ref 79–97)
Platelets: 220 10*3/uL (ref 150–450)
RBC: 5.14 x10E6/uL (ref 4.14–5.80)
RDW: 12.7 % (ref 11.6–15.4)
WBC: 4.9 10*3/uL (ref 3.4–10.8)

## 2021-08-26 LAB — HEPATIC FUNCTION PANEL
ALT: 53 IU/L — ABNORMAL HIGH (ref 0–44)
AST: 36 IU/L (ref 0–40)
Albumin: 4.2 g/dL (ref 4.0–5.0)
Alkaline Phosphatase: 89 IU/L (ref 44–121)
Bilirubin Total: 1 mg/dL (ref 0.0–1.2)
Bilirubin, Direct: 0.28 mg/dL (ref 0.00–0.40)
Total Protein: 6.9 g/dL (ref 6.0–8.5)

## 2021-08-26 LAB — LIPID PANEL
Chol/HDL Ratio: 3 ratio (ref 0.0–5.0)
Cholesterol, Total: 154 mg/dL (ref 100–199)
HDL: 51 mg/dL (ref >39)
LDL Chol Calc (NIH): 87 mg/dL (ref 0–99)
Triglycerides: 82 mg/dL (ref 0–149)
VLDL Cholesterol Cal: 16 mg/dL (ref 5–40)

## 2021-08-26 LAB — LIPOPROTEIN A (LPA): Lipoprotein (a): <8.4 nmol/L (ref <75.0)

## 2021-08-26 LAB — HEMOGLOBIN A1C
Est. average glucose Bld gHb Est-mCnc: 111 mg/dL
Hgb A1c MFr Bld: 5.5 % (ref 4.8–5.6)

## 2021-08-26 LAB — TSH: TSH: 1 u[IU]/mL (ref 0.450–4.500)

## 2021-09-19 ENCOUNTER — Other Ambulatory Visit: Payer: Self-pay | Admitting: Cardiovascular Disease

## 2021-10-20 ENCOUNTER — Ambulatory Visit
Admission: EM | Admit: 2021-10-20 | Discharge: 2021-10-20 | Disposition: A | Discharge status: Home / Self Care | Payer: 59 | Attending: Internal Medicine | Admitting: Internal Medicine

## 2021-10-20 DIAGNOSIS — J029 Acute pharyngitis, unspecified: Secondary | ICD-10-CM | POA: Diagnosis not present

## 2021-10-20 DIAGNOSIS — J069 Acute upper respiratory infection, unspecified: Secondary | ICD-10-CM | POA: Insufficient documentation

## 2021-10-20 DIAGNOSIS — R051 Acute cough: Secondary | ICD-10-CM | POA: Diagnosis present

## 2021-10-20 LAB — POCT RAPID STREP A (OFFICE): Rapid Strep A Screen: NEGATIVE

## 2021-10-20 MED ORDER — BENZONATATE 100 MG PO CAPS: 100.0000 mg | ORAL_CAPSULE | Freq: Three times a day (TID) | ORAL | 0 refills | Status: DC | PRN

## 2021-10-20 MED ORDER — AMOXICILLIN-POT CLAVULANATE 875-125 MG PO TABS: 1.0000 | ORAL_TABLET | Freq: Two times a day (BID) | ORAL | 0 refills | Status: DC

## 2021-10-20 NOTE — ED Provider Notes (Signed)
[facility]    CSN: [id number] Arrival date & time: 10/20/21  1316      History   Chief Complaint Chief Complaint  Patient presents with   Nasal Congestion    HPI Jason Osborne is a 36 y.o. male.   Patient presents with a cough and nasal congestion that has been present for approximately 6 to 7 days.  He reports that his child recently tested positive for influenza and strep throat.  He has had an occasional sore throat.  Denies any known fevers at home.  Denies chest pain, shortness of breath, ear pain, nausea, vomiting, diarrhea, abdominal pain.  Patient does have recurrent sinus infection and states that this "feels similar".  Also has history of allergic rhinitis and takes multiple different antihistamines and receives allergy shots monthly.  Patient has taken multiple over-the-counter cold and flu medication with no improvement in symptoms.     Past Medical History:  Diagnosis Date   Hyperlipidemia     Patient Active Problem List   Diagnosis Date Noted   Mild obesity 08/13/2014   Hyperlipidemia 03/05/2013    Past Surgical History:  Procedure Laterality Date   HAND SURGERY Left    VASECTOMY         Home Medications    Prior to Admission medications   Medication Sig Start Date End Date Taking? Authorizing Provider  amoxicillin-clavulanate (AUGMENTIN) 875-125 MG tablet Take 1 tablet by mouth every 12 (twelve) hours. 10/20/21  Yes Pricella Gaugh, Rolly SalterHaley E, FNP  benzonatate (TESSALON) 100 MG capsule Take 1 capsule (100 mg total) by mouth every 8 (eight) hours as needed for cough. 10/20/21  Yes Bonham Zingale, Rolly SalterHaley E, FNP  Desloratadine (CLARINEX PO) Take 1 tablet by mouth daily.    [provider]  EPINEPHrine 0.3 mg/0.3 mL IJ SOAJ injection  05/29/16   [provider]  ezetimibe (ZETIA) 10 MG tablet Take 1 tablet (10 mg total) by mouth daily. 06/29/21   Lennette BihariKelly, Thomas A, MD  levocetirizine (XYZAL) 5 MG tablet SMARTSIG:1 Tablet(s) By Mouth Every Evening 10/16/19    [provider]  montelukast (SINGULAIR) 10 MG tablet Take 10 mg by mouth at bedtime.    [provider]  NON FORMULARY Allergy injection    [provider]  omeprazole (PRILOSEC) 10 MG capsule Take 10 mg by mouth daily.    [provider]  REPATHA SURECLICK 140 MG/ML SOAJ ADMINISTER 1 ML UNDER THE SKIN EVERY 14 DAYS 07/26/21   Lennette BihariKelly, Thomas A, MD  rosuvastatin (CRESTOR) 10 MG tablet TAKE 1 TABLET(10 MG) BY MOUTH EVERY OTHER DAY 09/19/21   Lennette BihariKelly, Thomas A, MD    Family History Family History  Problem Relation Age of Onset   Hypertension Mother    Heart attack Father [id number]   Heart disease Father    Hyperlipidemia Brother    Hyperlipidemia Maternal Grandfather    Hypertension Maternal Grandfather    Cancer - Lung Maternal Grandfather    Hyperlipidemia Brother    Heart disease Paternal Grandfather    Hyperlipidemia Paternal Grandfather    Hypertension Paternal Grandfather    Melanoma Paternal Grandfather     Social History Social History   Tobacco Use   Smoking status: Never   Smokeless tobacco: Never  Vaping Use   Vaping Use: Never used  Substance Use Topics   Alcohol use: No   Drug use: No     Allergies   Patient has no known allergies.   Review of Systems Review of Systems  Per HPI  Physical Exam Triage Vital Signs ED Triage Vitals  Enc Vitals Group     BP 10/20/21 1344 113/74     Pulse Rate 10/20/21 1344 94     Resp 10/20/21 1344 18     Temp 10/20/21 1344 98.4 F (36.9 C)     Temp Source 10/20/21 1344 Oral     SpO2 10/20/21 1344 94 %     Weight --      Height --      Head Circumference --      Peak Flow --      Pain Score 10/20/21 1343 0     Pain Loc --      Pain Edu? --      Excl. in GC? --    No data found.  Updated Vital Signs BP 113/74 (BP Location: Left Arm)   Pulse 94   Temp 98.4 F (36.9 C) (Oral)   Resp 18   SpO2 94%   Visual Acuity Right Eye Distance:   Left Eye Distance:   Bilateral Distance:     Right Eye Near:   Left Eye Near:    Bilateral Near:     Physical Exam Constitutional:      General: He is not in acute distress.    Appearance: Normal appearance. He is not toxic-appearing or diaphoretic.  HENT:     Head: Normocephalic and atraumatic.     Right Ear: Tympanic membrane and ear canal normal.     Left Ear: Tympanic membrane and ear canal normal.     Nose: Congestion present.     Mouth/Throat:     Mouth: Mucous membranes are moist.     Pharynx: Posterior oropharyngeal erythema present.  Eyes:     Extraocular Movements: Extraocular movements intact.     Conjunctiva/sclera: Conjunctivae normal.     Pupils: Pupils are equal, round, and reactive to light.  Cardiovascular:     Rate and Rhythm: Normal rate and regular rhythm.     Pulses: Normal pulses.     Heart sounds: Normal heart sounds.  Pulmonary:     Effort: Pulmonary effort is normal. No respiratory distress.     Breath sounds: Normal breath sounds. No stridor. No wheezing, rhonchi or rales.  Abdominal:     General: Abdomen is flat. Bowel sounds are normal.     Palpations: Abdomen is soft.  Musculoskeletal:        General: Normal range of motion.     Cervical back: Normal range of motion.  Skin:    General: Skin is warm and dry.  Neurological:     General: No focal deficit present.     Mental Status: He is alert and oriented to person, place, and time. Mental status is at baseline.  Psychiatric:        Mood and Affect: Mood normal.        Behavior: Behavior normal.      UC Treatments / Results  Labs (all labs ordered are listed, but only abnormal results are displayed) Labs Reviewed  CULTURE, GROUP A STREP [facility])  POCT RAPID STREP A (OFFICE)    EKG   Radiology No results found.  Procedures Procedures (including critical care time)  Medications Ordered in UC Medications - No data to display  Initial Impression / Assessment and Plan / UC Course  I have reviewed the triage vital signs  and the nursing notes.  Pertinent labs & imaging results that were available during my care of  the patient were reviewed by me and considered in my medical decision making (see chart for details).     Strep was negative.  Throat culture is pending.  Do not think that viral testing is necessary given duration of symptoms and the patient declined with shared decision making.  Highly suspicious that persistent upper respiratory infection is related to a sinus infection given patient's recurrent history.  Will treat with Augmentin antibiotic.  Discussed supportive care with patient as well.  Discussed return precautions.  Patient verbalized understanding and was agreeable with plan. Final Clinical Impressions(s) / UC Diagnoses   Final diagnoses:  Acute upper respiratory infection  Acute cough  Sore throat     Discharge Instructions      Rapid strep was negative.  Throat culture is pending.  We will call if it is positive.  You have been prescribed an antibiotic to help alleviate symptoms.  Please follow-up if symptoms persist or worsen.     ED Prescriptions     Medication Sig Dispense Auth. Provider   amoxicillin-clavulanate (AUGMENTIN) 875-125 MG tablet Take 1 tablet by mouth every 12 (twelve) hours. 14 tablet [place], Selma E, [place]   benzonatate (TESSALON) 100 MG capsule Take 1 capsule (100 mg total) by mouth every 8 (eight) hours as needed for cough. 21 capsule [place], Acie Fredrickson, [place]      PDMP not reviewed this encounter.   Gustavus Bryant, [place] 10/20/21 1434

## 2021-10-20 NOTE — ED Triage Notes (Signed)
Pt present cough with congestion, symptoms started last Friday. Pt states that at night the cough is worst and he has tried OTC medication with no relief.

## 2021-10-20 NOTE — Discharge Instructions (Signed)
Rapid strep was negative.  Throat culture is pending.  We will call if it is positive.  You have been prescribed an antibiotic to help alleviate symptoms.  Please follow-up if symptoms persist or worsen.

## 2021-10-23 LAB — CULTURE, GROUP A STREP (THRC)

## 2021-10-26 ENCOUNTER — Ambulatory Visit
Admission: RE | Admit: 2021-10-26 | Discharge: 2021-10-26 | Disposition: A | Discharge status: Home / Self Care | Payer: 59 | Source: Ambulatory Visit | Attending: Internal Medicine | Admitting: Internal Medicine

## 2021-10-26 VITALS — BP 119/83 | HR 107 | Temp 99.7°F | Resp 18

## 2021-10-26 DIAGNOSIS — J309 Allergic rhinitis, unspecified: Secondary | ICD-10-CM | POA: Diagnosis not present

## 2021-10-26 MED ORDER — FLUTICASONE PROPIONATE 50 MCG/ACT NA SUSP: 1.0000 | Freq: Every day | NASAL | 0 refills | Status: AC

## 2021-10-26 MED ORDER — PREDNISONE 20 MG PO TABS
20.0000 mg | ORAL_TABLET | Freq: Every day | ORAL | 0 refills | Status: AC
Start: 2021-10-26 — End: 2021-10-31

## 2021-10-26 NOTE — ED Provider Notes (Signed)
[facility]    CSN: [id number] Arrival date & time: 10/26/21  0853      History   Chief Complaint Chief Complaint  Patient presents with   Cough    HPI Jason Osborne is a 36 y.o. male with a history of multiple environmental allergens currently on immunologic shots returns to urgent care for persistent symptoms.  Patient was seen here last week and prescribed a course of Augmentin.  Patient continues to have nasal congestion, sinus pressure and some ear fullness.  No fever or chills.  No nausea, vomiting or diarrhea.  Patient has not had his allergy shots recently.  No dizziness, near syncope or syncopal episodes.  No shortness of breath or wheezing.  Patient has been using Sudafed and Mucinex as well as vapor rub with no significant improvement in symptoms.  HPI  Past Medical History:  Diagnosis Date   Hyperlipidemia     Patient Active Problem List   Diagnosis Date Noted   Mild obesity 08/13/2014   Hyperlipidemia 03/05/2013    Past Surgical History:  Procedure Laterality Date   HAND SURGERY Left    VASECTOMY         Home Medications    Prior to Admission medications   Medication Sig Start Date End Date Taking? Authorizing Provider  fluticasone (FLONASE) 50 MCG/ACT nasal spray Place 1 spray into both nostrils daily. 10/26/21  Yes Casey Fye, Britta Mccreedy, MD  predniSONE (DELTASONE) 20 MG tablet Take 1 tablet (20 mg total) by mouth daily for 5 days. 10/26/21 10/31/21 Yes Dalynn Jhaveri, Britta Mccreedy, MD  amoxicillin-clavulanate (AUGMENTIN) 875-125 MG tablet Take 1 tablet by mouth every 12 (twelve) hours. 10/20/21   Gustavus Bryant, FNP  benzonatate (TESSALON) 100 MG capsule Take 1 capsule (100 mg total) by mouth every 8 (eight) hours as needed for cough. 10/20/21   Gustavus Bryant, FNP  Desloratadine (CLARINEX PO) Take 1 tablet by mouth daily.    [provider]  EPINEPHrine 0.3 mg/0.3 mL IJ SOAJ injection  05/29/16   [provider]  ezetimibe (ZETIA) 10 MG tablet  Take 1 tablet (10 mg total) by mouth daily. 06/29/21   [name], MD  levocetirizine (XYZAL) 5 MG tablet SMARTSIG:1 Tablet(s) By Mouth Every Evening 10/16/19   [provider]  montelukast (SINGULAIR) 10 MG tablet Take 10 mg by mouth at bedtime.    [provider]  NON FORMULARY Allergy injection    [provider]  omeprazole (PRILOSEC) 10 MG capsule Take 10 mg by mouth daily.    [provider]  REPATHA SURECLICK 140 MG/ML SOAJ ADMINISTER 1 ML UNDER THE SKIN EVERY 14 DAYS 07/26/21   [name], MD  rosuvastatin (CRESTOR) 10 MG tablet TAKE 1 TABLET(10 MG) BY MOUTH EVERY OTHER DAY 09/19/21   [name], MD    Family History Family History  Problem Relation Age of Onset   Hypertension Mother    Heart attack Father 17   Heart disease Father    Hyperlipidemia Brother    Hyperlipidemia Maternal Grandfather    Hypertension Maternal Grandfather    Cancer - Lung Maternal Grandfather    Hyperlipidemia Brother    Heart disease Paternal Grandfather    Hyperlipidemia Paternal Grandfather    Hypertension Paternal Grandfather    Melanoma Paternal Grandfather     Social History Social History   Tobacco Use   Smoking status: Never   Smokeless tobacco: Never  Vaping Use   Vaping Use: Never used  Substance Use Topics   Alcohol use: No   Drug use: No     Allergies   Patient has no known allergies.   Review of Systems Review of Systems  Constitutional: Negative.   HENT:  Positive for congestion, sinus pressure and sinus pain. Negative for sore throat.   Eyes: Negative.   Respiratory:  Positive for cough. Negative for shortness of breath and wheezing.   Cardiovascular: Negative.   Gastrointestinal: Negative.      Physical Exam Triage Vital Signs ED Triage Vitals [10/26/21 0904]  Enc Vitals Group     BP 119/83     Pulse Rate (!) 107     Resp 18     Temp 99.7 F (37.6 C)     Temp Source Oral     SpO2 95 %     Weight       Height      Head Circumference      Peak Flow      Pain Score 0     Pain Loc      Pain Edu?      Excl. in GC?    No data found.  Updated Vital Signs BP 119/83 (BP Location: Left Arm)   Pulse (!) 107   Temp 99.7 F (37.6 C) (Oral)   Resp 18   SpO2 95%   Visual Acuity Right Eye Distance:   Left Eye Distance:   Bilateral Distance:    Right Eye Near:   Left Eye Near:    Bilateral Near:     Physical Exam Vitals and nursing note reviewed.  Constitutional:      Appearance: He is ill-appearing. He is not toxic-appearing or diaphoretic.  HENT:     Right Ear: Tympanic membrane normal.     Left Ear: Tympanic membrane normal.  Eyes:     Extraocular Movements: Extraocular movements intact.     Pupils: Pupils are equal, round, and reactive to light.  Cardiovascular:     Rate and Rhythm: Normal rate and regular rhythm.     Pulses: Normal pulses.     Heart sounds: Normal heart sounds.  Abdominal:     General: Bowel sounds are normal.     Palpations: Abdomen is soft.  Musculoskeletal:        General: Normal range of motion.  Neurological:     Mental Status: He is alert.      UC Treatments / Results  Labs (all labs ordered are listed, but only abnormal results are displayed) Labs Reviewed - No data to display  EKG   Radiology No results found.  Procedures Procedures (including critical care time)  Medications Ordered in UC Medications - No data to display  Initial Impression / Assessment and Plan / UC Course  I have reviewed the triage vital signs and the nursing notes.  Pertinent labs & imaging results that were available during my care of the patient were reviewed by me and considered in my medical decision making (see chart for details).     1.  Allergic sinusitis: Patient is advised to complete the course of antibiotics Prednisone 20 mg orally daily for 5 days Fluticasone nasal spray Saline nasal spray twice daily Humidifier and vapor rub use will  help with nasal congestion Maintain adequate hydration Return precautions given. Final Clinical Impressions(s) / UC Diagnoses   Final diagnoses:  Allergic sinusitis     Discharge Instructions      Please complete the course of antibiotics Take medications as  prescribed Saline nasal spray twice a day Continue taking cough medications Please stop taking Sudafed Return to urgent care if symptoms worsen.   ED Prescriptions     Medication Sig Dispense Auth. Provider   fluticasone (FLONASE) 50 MCG/ACT nasal spray Place 1 spray into both nostrils daily. 16 g Sofiya Ezelle, Britta MccreedyPhilip O, MD   predniSONE (DELTASONE) 20 MG tablet Take 1 tablet (20 mg total) by mouth daily for 5 days. 5 tablet Jamerica Snavely, Britta MccreedyPhilip O, MD      PDMP not reviewed this encounter.   Merrilee JanskyLamptey, Sherrice Creekmore O, MD 10/26/21 1009

## 2021-10-26 NOTE — Discharge Instructions (Signed)
Please complete the course of antibiotics Take medications as prescribed Saline nasal spray twice a day Continue taking cough medications Please stop taking Sudafed Return to urgent care if symptoms worsen.

## 2021-10-26 NOTE — ED Triage Notes (Signed)
Pt c/o cough, congestion, low-grade fever at home. Was seen at this [place] last week and told to return if no improvement.

## 2021-11-03 LAB — LIPID PANEL
Chol/HDL Ratio: 2.3 ratio (ref 0.0–5.0)
Cholesterol, Total: 109 mg/dL (ref 100–199)
HDL: 48 mg/dL (ref >39)
LDL Chol Calc (NIH): 47 mg/dL (ref 0–99)
Triglycerides: 65 mg/dL (ref 0–149)
VLDL Cholesterol Cal: 14 mg/dL (ref 5–40)

## 2021-11-03 LAB — HEPATIC FUNCTION PANEL
ALT: 55 IU/L — ABNORMAL HIGH (ref 0–44)
AST: 37 IU/L (ref 0–40)
Albumin: 4.2 g/dL (ref 4.0–5.0)
Alkaline Phosphatase: 89 IU/L (ref 44–121)
Bilirubin Total: 0.7 mg/dL (ref 0.0–1.2)
Bilirubin, Direct: 0.24 mg/dL (ref 0.00–0.40)
Total Protein: 6.5 g/dL (ref 6.0–8.5)

## 2021-11-08 ENCOUNTER — Encounter: Payer: Self-pay | Admitting: Cardiovascular Disease

## 2021-11-08 ENCOUNTER — Ambulatory Visit (INDEPENDENT_AMBULATORY_CARE_PROVIDER_SITE_OTHER): Payer: 59 | Admitting: Cardiovascular Disease

## 2021-11-08 VITALS — BP 120/90 | HR 67 | Ht 68.0 in | Wt 241.6 lb

## 2021-11-08 DIAGNOSIS — Z8249 Family history of ischemic heart disease and other diseases of the circulatory system: Secondary | ICD-10-CM | POA: Diagnosis not present

## 2021-11-08 DIAGNOSIS — E7801 Familial hypercholesterolemia: Secondary | ICD-10-CM | POA: Diagnosis not present

## 2021-11-08 DIAGNOSIS — Z79899 Other long term (current) drug therapy: Secondary | ICD-10-CM | POA: Diagnosis not present

## 2021-11-08 MED ORDER — EZETIMIBE 10 MG PO TABS: 10.0000 mg | ORAL_TABLET | Freq: Every day | ORAL | 3 refills | Status: DC

## 2021-11-08 MED ORDER — ROSUVASTATIN CALCIUM 10 MG PO TABS: ORAL_TABLET | ORAL | 3 refills | Status: DC

## 2022-05-13 LAB — LIPOPROTEIN A (LPA): Lipoprotein (a): <8.4 nmol/L (ref <75.0)

## 2022-08-18 ENCOUNTER — Other Ambulatory Visit: Payer: Self-pay | Admitting: Cardiovascular Disease

## 2022-08-18 DIAGNOSIS — E7801 Familial hypercholesterolemia: Secondary | ICD-10-CM

## 2022-08-28 ENCOUNTER — Other Ambulatory Visit (HOSPITAL_COMMUNITY): Payer: Self-pay

## 2022-12-14 ENCOUNTER — Other Ambulatory Visit: Payer: Self-pay | Admitting: Cardiovascular Disease

## 2022-12-15 ENCOUNTER — Other Ambulatory Visit: Payer: Self-pay | Admitting: Cardiovascular Disease

## 2023-02-01 ENCOUNTER — Other Ambulatory Visit: Payer: Self-pay | Admitting: Cardiovascular Disease

## 2023-02-26 ENCOUNTER — Other Ambulatory Visit: Payer: Self-pay | Admitting: Cardiovascular Disease

## 2023-02-27 ENCOUNTER — Other Ambulatory Visit: Payer: Self-pay | Admitting: Cardiovascular Disease

## 2023-03-07 ENCOUNTER — Other Ambulatory Visit: Payer: Self-pay | Admitting: Cardiovascular Disease

## 2023-03-07 DIAGNOSIS — E7801 Familial hypercholesterolemia: Secondary | ICD-10-CM

## 2023-04-03 ENCOUNTER — Other Ambulatory Visit: Payer: Self-pay | Admitting: Cardiovascular Disease

## 2023-05-24 ENCOUNTER — Ambulatory Visit: Payer: 59 | Attending: Cardiovascular Disease | Admitting: Cardiovascular Disease

## 2023-05-24 ENCOUNTER — Encounter: Payer: Self-pay | Admitting: Cardiovascular Disease

## 2023-05-24 VITALS — BP 126/84 | HR 62 | Ht 68.0 in | Wt 242.4 lb

## 2023-05-24 DIAGNOSIS — Z6836 Body mass index (BMI) 36.0-36.9, adult: Secondary | ICD-10-CM

## 2023-05-24 DIAGNOSIS — E7801 Familial hypercholesterolemia: Secondary | ICD-10-CM | POA: Diagnosis not present

## 2023-05-24 DIAGNOSIS — Z8249 Family history of ischemic heart disease and other diseases of the circulatory system: Secondary | ICD-10-CM

## 2023-05-24 DIAGNOSIS — E66812 Obesity, class 2: Secondary | ICD-10-CM

## 2023-05-24 DIAGNOSIS — R7989 Other specified abnormal findings of blood chemistry: Secondary | ICD-10-CM | POA: Diagnosis not present

## 2023-05-24 DIAGNOSIS — E6609 Other obesity due to excess calories: Secondary | ICD-10-CM

## 2023-05-24 NOTE — Patient Instructions (Signed)
 Medication Instructions:  The current medical regimen is effective. Continue present plan and medications as directed. Please refer to the Current Medication list given to you today.     *If you need a refill on your cardiac medications before your next appointment, please call your pharmacy*   Lab Work: Fasting labs drawn today If you have labs (blood work) drawn today and your tests are completely normal, you will receive your results only by: MyChart Message (if you have MyChart) OR A paper copy in the mail If you have any lab test that is abnormal or we need to change your treatment, we will call you to review the results.   Follow-Up: At [facility], you and your health needs are our priority.  As part of our continuing mission to provide you with exceptional heart care, we have created designated Provider Care Teams.  These Care Teams include your primary Cardiologist (physician) and Advanced Practice Providers (APPs -  Physician Assistants and Nurse Practitioners) who all work together to provide you with the care you need, when you need it.  We recommend signing up for the patient portal called MyChart.  Sign up information is provided on this After Visit Summary.  MyChart is used to connect with patients for Virtual Visits (Telemedicine).  Patients are able to view lab/test results, encounter notes, upcoming appointments, etc.  Non-urgent messages can be sent to your provider as well.   To learn more about what you can do with MyChart, go to [email or web address].    Your next appointment:   1 year(s)  Provider:   Lonni Nanas, MD  {  Other Instructions Thank you for choosing [facility]!

## 2023-05-24 NOTE — Progress Notes (Signed)
 Patient ID: Jason Osborne, male   DOB: 1985/08/23, 38 y.o.   MRN: [id number]       HPI: Jason Osborne is a 38 y.o. male who presents to the office today for a 43 month cardiology evaluation.  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  He tells me that at age 52 he was first diagnosed as having total cholesterols in excess of 400 and believes his value was 485.  At that time, his father had died with a myocardial infarction at age 2 and he was 38 years old and it was recommended that children have their cholesterol levels checked.  He was evaluated at [facility] and initiated therapy with crystallize vitamin C.  Also was started on what sounds like bile acid sequestrant.  He was started on statins at age 89.  He had been on Lipitor but developed some mild LFT elevation. More recently he has  been on lower dose Crestor  as well as combination with Zetia . He has strong family history for coronary artery disease in that his father died of myocardial infarction at age 34. Multiple family members  had coronary artery disease as well as significant aortic valve stenosis.  He has had issues with occasional myalgias in the past on Lipitor and on higher dose Crestor .  Over the past year, due to Zetia  not being generic at the time and the cost was significant.  He discontinued Zetia  and was maintained on only low-dose Crestor .  Blood work from 07/18/2016 revealed a total cholesterol 177, triglycerides 56, HDL 44, and LDL 122.  AST was 40, and ALT was minimally increased at 70.  Bilirubin was normal, as were other electrolytes.  His hemoglobin and hematocrit were stable.  When I saw him in March 2018, he had been off Zetia  and had only been taking Crestor  at 10 mg.  I recommended reinitiation of Zetia  10 mg and increase Crestor  to 20 mg.  Repeat laboratory on this therapy showed improvement but continues to show an LDL cholesterol at 104 with total cholesterol  165, triglycerides 79 and HDL 45.    When I saw him in follow-up in May 2018 he was not a candidate for further titration of Crestor  due to LFT elevation.  I initiated the process for Repatha .  I last saw him in October 2019 at which time he was on Repatha  for over year  with excellent LDL reduction.  Laboratory 2 weeks prior to that office visit has shown a total cholesterol at 89, triglycerides 58, HDL 47, and LDL cholesterol at 30.  His LFTs remain mildly increased with an AST of 43 and ALT of 76.  He was contacted by telephone and his Crestor  was reduced from 20 mg to 10 mg.  Over the year, he had gained weight.  His peak weight had increased to 242.  He does karate several days per week and most recently he admits to an 11 pound weight loss.  He denies any chest pain palpitations PND orthopnea.    I saw him in November 2020 and since his prior evaluation in October 2019 he continued to do well.   He has had purposeful weight loss and has lost approximately 35 pounds.  He had undergone recent laboratory on February 05, 2019 which showed a total cholesterol 77, HDL 45, LDL 21 and triglycerides 33.  AST had improved to 44 but ALT remains elevated at 71.  He has been taking Crestor  10  mg daily.  At that time with his ALT remains elevated I recommended changing rosuvastatin  to every other day.  I saw him on December 12, 2019.  Since his prior evaluation he had felt well.  However he had some back issues which resulted in him not being very active.  He continues to work in the therapist, nutritional business. He admits to weight gain.  He denies chest pain PND orthopnea.  He underwent follow-up laboratory on Sep 17, 2019.  On his reduced dose of rosuvastatin  ALT was now 45 and AST was normal at 32.  He continues to be on Repatha  every 2 weeks in addition to Zetia  and LDL cholesterol was 54 with total cholesterol 124.  He denies chest pain PND orthopnea.  One of his sons has been diagnosed with Long Covid apparently has  undergone evaluations for migraine headaches and loss of feeling in his arms.    I saw him on June 09, 2021. Since I last saw him, he has not been successful with weight loss.  His children are now 15, 13 and 8 and all of had COVID and one had subsequent grand mal seizures and his other son had migraine issues.  He has continued to be on Zetia  10 mg and has been taking rosuvastatin  10 mg every other day due to mild liver function elevation.  He does not drink alcohol.  He continues to be on Repatha  140 mg injection every 14 days.  He underwent recent laboratory on April 15, 2021.  This now shows slight slight increase in his LDL cholesterol from 54 in May 2000 21-82 on April 15, 2021.  He was very mild ALT elevation at 47 with upper normal at 44.  AST was normal at 34.  He denies any chest pain.  He has not been active and has not been exercising.  I discussed the importance of improving his diet and.  I recommend follow-up laboratory in several months and for him to undergo coronary calcium  score for baseline assessment of coronary atherosclerosis.  He underwent a coronary calcium  score on July 26, 2021.  Calcium  score was 4.99, representing 48 percentile for age, race and sex matched control.  There was mild calcification in the LAD.  Last saw him on November 08, 2021 at which time he felt well and denied chest pain or shortness of breath. He is not had significant success with weight loss.  Recently he has had issues with sinus congestion.  He underwent repeat laboratory on June 21 which showed total cholesterol 109, triglycerides 65, HDL 48, and LDL 47.  ALT was normal at 37 and ALT remain minimally increased at 55.  He denies chest pain or shortness of breath or palpitations.  He typically works 10-hour days and does not routinely exercise.    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  He works 1 weekend a month in addition to typical 10-hour days.  He has had some  nasal congestion and has been undergoing allergy shots.  LP(a) had been checked in December 2029 which was normal at less than 8.4.  His last laboratory was June 2023 which showed mild residual transaminase elevation with AST at 37 ALT 55.  Total cholesterol was 109, LDL cholesterol 47 triglycerides 65 and HDL 48.  He has not had recent laboratory.  He continues to be on Zetia  10 mg rosuvastatin  10 mg every other day.  He takes Flonase  and Singulair for  his allergies.  He is on Prilosec for GERD.  He is not fasting today.  He presents for evaluation.  Past Medical History:  Diagnosis Date   Hyperlipidemia     Past Surgical History:  Procedure Laterality Date   HAND SURGERY Left    VASECTOMY      No Known Allergies  Current Outpatient Medications  Medication Sig Dispense Refill   Bioflavonoid Products (SUPER C-500 PO) Take by mouth daily.     Desloratadine (CLARINEX PO) Take 1 tablet by mouth daily.     EPINEPHrine  0.3 mg/0.3 mL IJ SOAJ injection   1   Evolocumab  (REPATHA  SURECLICK) 140 MG/ML SOAJ ADMINISTER 1 ML UNDER THE SKIN EVERY 14 DAYS 6 mL 3   ezetimibe  (ZETIA ) 10 MG tablet Take 1 tablet (10 mg total) by mouth daily. 90 tablet 0   fluticasone  (FLONASE ) 50 MCG/ACT nasal spray Place 1 spray into both nostrils daily. 16 g 0   levocetirizine (XYZAL) 5 MG tablet SMARTSIG:1 Tablet(s) By Mouth Every Evening     montelukast (SINGULAIR) 10 MG tablet Take 10 mg by mouth at bedtime.     NON FORMULARY Allergy injection     omeprazole (PRILOSEC) 10 MG capsule Take 10 mg by mouth daily.     rosuvastatin  (CRESTOR ) 10 MG tablet TAKE 1 TABLET(10 MG) BY MOUTH EVERY OTHER DAY 15 tablet 3   No current facility-administered medications for this visit.    Social History   Socioeconomic History   Marital status: Married    Spouse name: Not on file   Number of children: Not on file   Years of education: Not on file   Highest education level: Not on file  Occupational History   Not on file   Tobacco Use   Smoking status: Never   Smokeless tobacco: Never  Vaping Use   Vaping status: Never Used  Substance and Sexual Activity   Alcohol use: No   Drug use: No   Sexual activity: Not on file  Other Topics Concern   Not on file  Social History Narrative   Not on file   Social Drivers of Health   Financial Resource Strain: Not on file  Food Insecurity: Not on file  Transportation Needs: Not on file  Physical Activity: Not on file  Stress: Not on file  Social Connections: Not on file  Intimate Partner Violence: Not on file    Family History  Problem Relation Age of Onset   Hypertension Mother    Heart attack Father 36   Heart disease Father    Hyperlipidemia Brother    Hyperlipidemia Maternal Grandfather    Hypertension Maternal Grandfather    Cancer - Lung Maternal Grandfather    Hyperlipidemia Brother    Heart disease Paternal Grandfather    Hyperlipidemia Paternal Grandfather    Hypertension Paternal Grandfather    Melanoma Paternal Grandfather    Additional social history is notable that he is married.  He has 3 children. 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  ROS General: Negative; No fevers, chills, or night sweats;  HEENT: Negative; No changes in vision or hearing, sinus congestion, difficulty swallowing Pulmonary: Negative; No cough, wheezing, shortness of breath, hemoptysis Cardiovascular: Negative; No chest pain, presyncope, syncope, palpitations GI: Negative; No nausea, vomiting, diarrhea, or abdominal pain GU: Negative; No dysuria, hematuria, or difficulty voiding Musculoskeletal: Negative; no myalgias, joint pain, or weakness Hematologic/Oncology: Negative; no easy bruising,  bleeding Endocrine: Negative; no heat/cold intolerance; no diabetes Neuro: Negative; no changes in balance, headaches Skin: Negative; No rashes or skin lesions Psychiatric: Negative; No behavioral  problems, depression Sleep: Negative; No snoring, daytime sleepiness, hypersomnolence, bruxism, restless legs, hypnogognic hallucinations, no cataplexy Other comprehensive 14 point system review is negative.   PE BP 126/84   Pulse 62   Ht 5' 8 (1.727 m)   Wt 242 lb 6.4 oz (110 kg)   SpO2 98%   BMI 36.86 kg/m    Repeat blood pressure by me was 112/76  Wt Readings from Last 3 Encounters:  05/24/23 242 lb 6.4 oz (110 kg)  11/08/21 241 lb 9.6 oz (109.6 kg)  06/09/21 245 lb (111.1 kg)   General: Alert, oriented, no distress.  Skin: normal turgor, no rashes, warm and dry HEENT: Normocephalic, atraumatic. Pupils equal round and reactive to light; sclera anicteric; extraocular muscles intact;  Nose without nasal septal hypertrophy Mouth/Parynx benign; Mallinpatti scale 3 Neck: No JVD, no carotid bruits; normal carotid upstroke Lungs: clear to ausculatation and percussion; no wheezing or rales Chest wall: without tenderness to palpitation Heart: PMI not displaced, RRR, s1 s2 normal, 1/6 systolic murmur, no diastolic murmur, no rubs, gallops, thrills, or heaves Abdomen: soft, nontender; no hepatosplenomehaly, BS+; abdominal aorta nontender and not dilated by palpation. Back: no CVA tenderness Pulses 2+ Musculoskeletal: full range of motion, normal strength, no joint deformities Extremities: no clubbing cyanosis or edema, Homan's sign negative  Neurologic: grossly nonfocal; Cranial nerves grossly wnl Psychologic: Normal mood and affect EKG Interpretation Date/Time:  Thursday May 24 2023 08:38:00 EST Ventricular Rate:  82 PR Interval:  138 QRS Duration:  86 QT Interval:  378 QTC Calculation: 441 R Axis:   30  Text Interpretation: Normal sinus rhythm Normal ECG No previous ECGs available Confirmed by Burnard Ned ([id number]) on 05/24/2023 3:28:59 PM    November 08, 2021 ECG (independently read by me): Normal sinus rhythm at 67 bpm.  June 09, 2021 ECG (independently read by me):   NSR at 77 with mid sinus arrythmia  December 12, 2019 ECG (independently read by me): Normal sinus rhythm at 63 bpm.  No ectopy.  Normal intervals.  T wave abnormality in lead III.  November 2020 ECG (independently read by me): Normal sinus rhythm at 75 bpm.  Normal intervals.  No ectopy.  October 2019 ECG (independently read by me): Normal sinus rhythm at 61 bpm.  No ectopy.  Normal intervals.  Nondiagnostic T change in lead III.  Sep 26, 2016 ECG (independently read by me): Normal sinus rhythm at 63 bpm.  Nondiagnostic T wave inversion in lead 3.  Normal intervals.  07/20/2016 ECG (independently read by me): Normal sinus rhythm with mild sinus arrhythmia.  Normal intervals.  Non-diagnostic T wave inversion in lead 3  ECG (independently read by me, and (: Normal sinus rhythm with mild sinus arrhythmia.  Heart rate 65 bpm.  Normal intervals.  Nondiagnostic T changes in lead 3.  October 2014 ECG: Normal sinus rhythm at 80 beats per minute  LABS:    Latest Ref Rng & Units 08/25/2021    9:08 AM 04/15/2021    9:05 AM 02/06/2018    8:13 AM  BMP  Glucose 70 - 99 mg/dL 899  90  95   BUN 6 - 20 mg/dL 12  18  14    Creatinine 0.76 - 1.27 mg/dL 9.02  8.97  8.96   BUN/Creat Ratio 9 - 20 12  18   14  Sodium 134 - 144 mmol/L 141  138  141   Potassium 3.5 - 5.2 mmol/L 4.2  4.4  4.8   Chloride 96 - 106 mmol/L 105  102  102   CO2 20 - 29 mmol/L 25  24  24    Calcium  8.7 - 10.2 mg/dL 9.5  9.7  9.6       Latest Ref Rng & Units 11/02/2021    8:42 AM 08/25/2021    9:08 AM 04/15/2021    9:05 AM  Hepatic Function  Total Protein 6.0 - 8.5 g/dL 6.5  6.8    6.9  7.5   Albumin 4.0 - 5.0 g/dL 4.2  4.5    4.2  4.6   AST 0 - 40 IU/L 37  35    36  34   ALT 0 - 44 IU/L 55  53    53  47   Alk Phosphatase 44 - 121 IU/L 89  91    89  95   Total Bilirubin 0.0 - 1.2 mg/dL 0.7  1.0    1.0  0.9   Bilirubin, Direct 0.00 - 0.40 mg/dL 9.75  9.71  9.74       Latest Ref Rng & Units 08/25/2021    9:08 AM 04/15/2021     9:05 AM 02/06/2018    8:13 AM  CBC  WBC 3.4 - 10.8 x10E3/uL 4.9  6.8  5.3   Hemoglobin 13.0 - 17.7 g/dL 84.4  83.8  83.9   Hematocrit 37.5 - 51.0 % 45.2  47.2  45.8   Platelets 150 - 450 x10E3/uL 220  220  237    Lab Results  Component Value Date   MCV 88 08/25/2021   MCV 89 04/15/2021   MCV 89 02/06/2018   Lab Results  Component Value Date   TSH 1.000 08/25/2021    Lipid Panel     Component Value Date/Time   CHOL 109 11/02/2021 0842   TRIG 65 11/02/2021 0842   HDL 48 11/02/2021 0842   CHOLHDL 2.3 11/02/2021 0842   CHOLHDL 3.7 09/22/2016 0847   VLDL 16 09/22/2016 0847   LDLCALC 47 11/02/2021 0842     RADIOLOGY: No results found.  IMPRESSION:  1. Familial hypercholesterolemia   2. Abnormal LFTs   3. Class 2 obesity due to excess calories without serious comorbidity with body mass index (BMI) of 36.0 to 36.9 in adult   4. 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  He was demonstrated to have cholesterols in excess of 400 prior to age 67 and has been on statin therapy since age 46.  In the past, he had some mild LFT elevation on Lipitor as well as development of myalgias to higher dose Crestor . He was on  Zetia  10 mg and Crestor  20 mg and was started on Repatha  injections in 2018.  He has demonstrated a dramatic benefit from initiation of Repatha  and initial follow-up blood work had shown an LDL cholesterol dropping down to 27.  Subsequent lipid studies have shown LDL cholesterols at 30 and 21.  When I  saw him LFTs had increased and I recommended reducing Crestor  to 10 mg.  In November 2020, laboratory laboratory showed a total cholesterol 77 triglycerides 33 HDL 45 and LDL cholesterol 21.  AST is improved to 44 but ALT remains elevated at 71.  At that time I  further reduce rosuvastatin  to 10 mg daily.  When evaluated in July 2021 over the past 9 months he had  issues with low back pain and essentially was not being as active as he had in the past.  His weight had increased.  At his evaluation in January 2023, I had a long discussion regarding diet and potential increased exercise.  I discussed Mediterranean diet.  I recommended he undergo coronary calcium  score for initial surveillance.  This was done on July 26, 2021.  Calcium  score was 4.99 representing 76 percentile for race, age and sex.  He continues to be on Zetia  10 mg and rosuvastatin  10 mg every other day in addition to Repatha  140 mg every 2 weeks.  He is on Singulair ,Flonase  and with his sinus infections had been on Augmentin .  Repeat lipid studies on November 02, 2020 showed improvement in his LDL cholesterol to 47 from 87 with total cholesterol now at 109 compared to 154.  AST is normal at 37 with ALT remaining minimally increased at 55.  His last laboratory in June 2023 showed LDL cholesterol at 47.  LP(a) tested in May 12, 2022 was excellent at less than 8.4.  Presently, blood pressure is stable.  Weight is 242 consistent with BMI of 36.86 and moderate obesity.  We discussed the importance of trying to exercise in addition to his work if at all possible.  He will undergo repeat laboratory in the fasting state with a comprehensive metabolic panel, CBC, hemoglobin A1c, TSH and fasting lipid panel.  I discussed with him my plans for retirement later this year.  In 1 year I have recommended he have a follow-up Cardiologic evaluation and we will transition him to the care of Dr. Lonni Nanas.   Debby RONAL Sor, MD, FACC  05/24/2023 3:39 PM

## 2023-05-28 ENCOUNTER — Other Ambulatory Visit: Payer: Self-pay | Admitting: Cardiovascular Disease

## 2023-06-06 IMAGING — CT CT CARDIAC CORONARY ARTERY CALCIUM SCORE
3 series · 14 of 20 positions shown, 16 images · non-contrast
Comparison: None.

Addendum:
CLINICAL DATA: Cardiovascular Disease Risk stratification

EXAM:
Coronary Calcium Score
TECHNIQUE: A gated, non-contrast computed tomography scan of the heart was
performed using 3mm slice thickness. Axial images were analyzed on a
dedicated workstation. Calcium scoring of the coronary arteries was
performed using the Agatston method.

[Series 2: cascseq 2.0 sa36 70% (id) · axial · 0.39mm/px · z∈[-222,-152]mm · 4 of 59 slices shown]
[im 12/59  vessel]
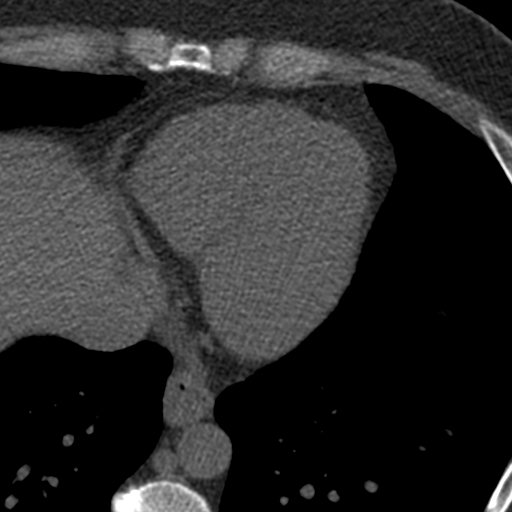
[im 24/59  vessel]
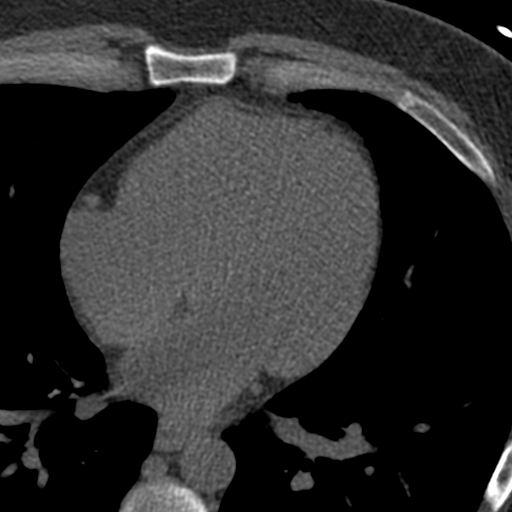
[im 35/59  vessel]
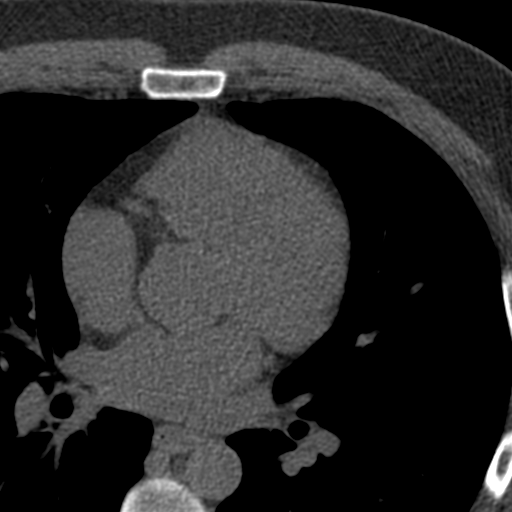
[im 47/59  vessel]
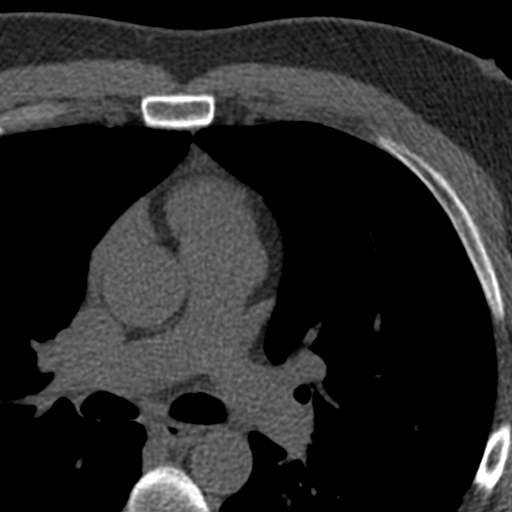

[Series 3: cascseq 2.0 bf37 st · axial · 0.73mm/px · z∈[-226,-148]mm · 5 of 59 slices shown, 7 images]
[im 10/59  vessel]
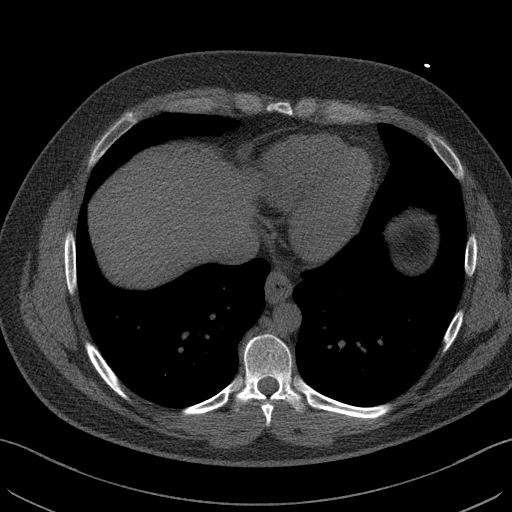
[im 10/59  lung]
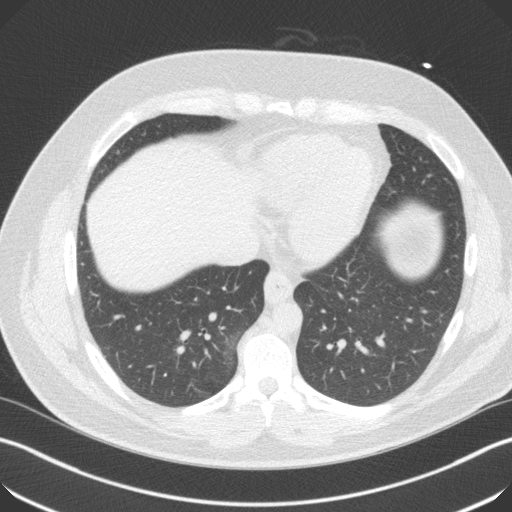
[im 20/59  vessel]
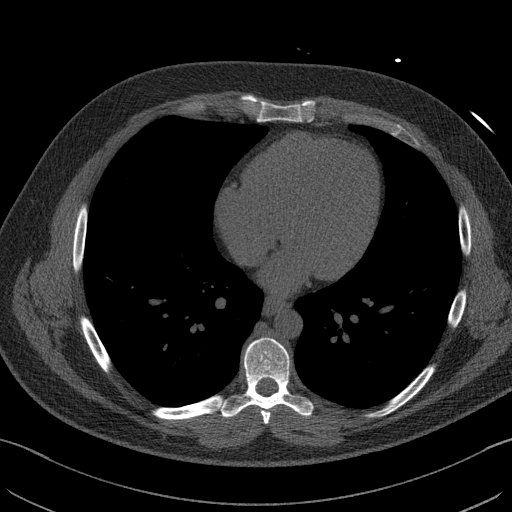
[im 30/59  vessel]
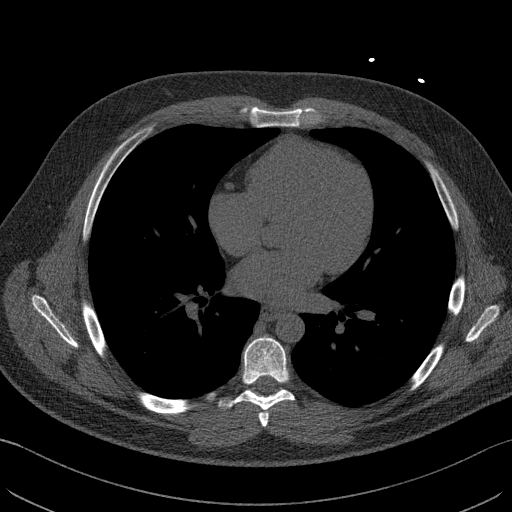
[im 39/59  vessel]
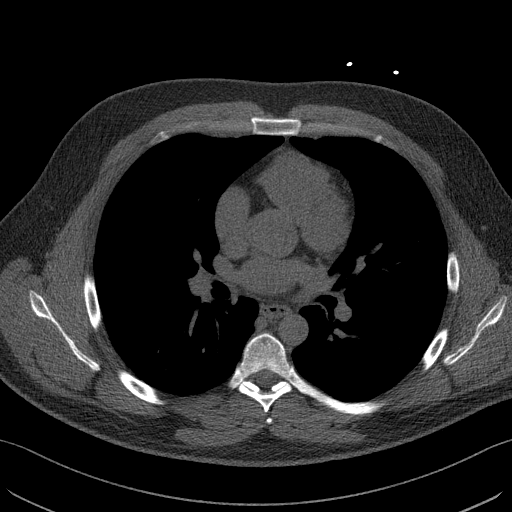
[im 49/59  vessel]
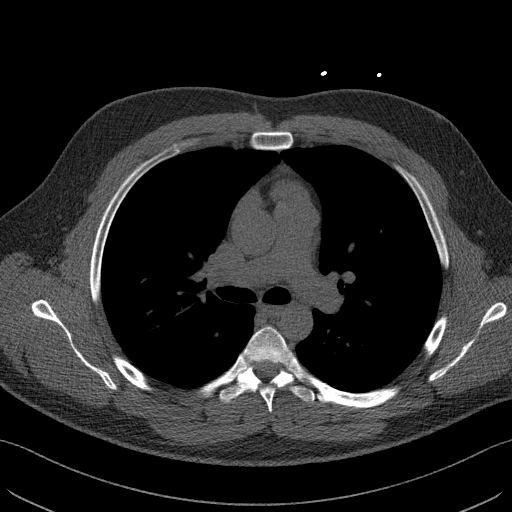
[im 49/59  lung]
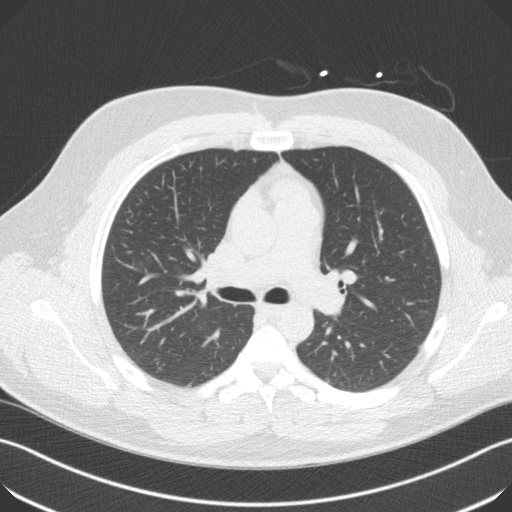

[Series 4: cascseq 2.0 br59 lung · axial · 0.73mm/px · z∈[-226,-148]mm · 5 of 59 slices shown]
[im 10/59  lung]
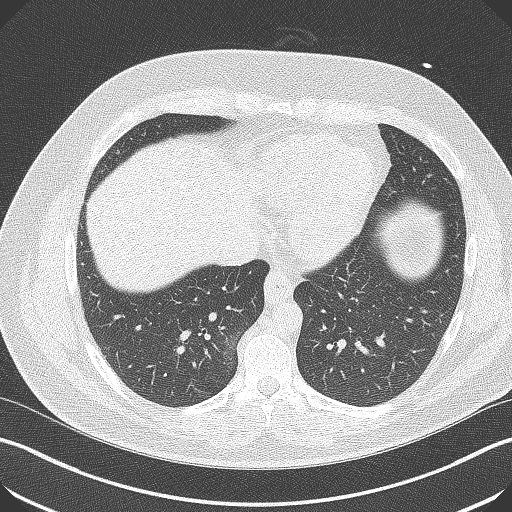
[im 20/59  lung]
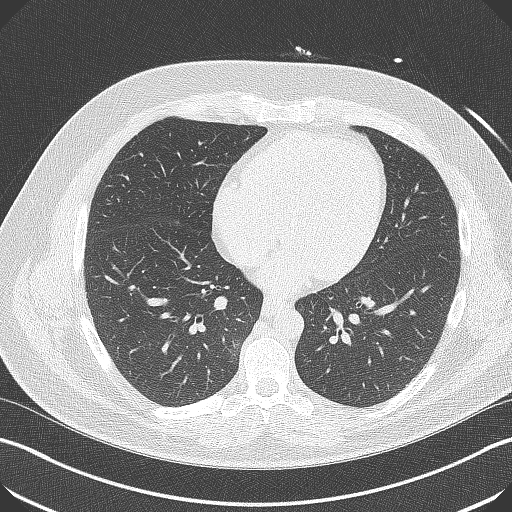
[im 30/59  lung]
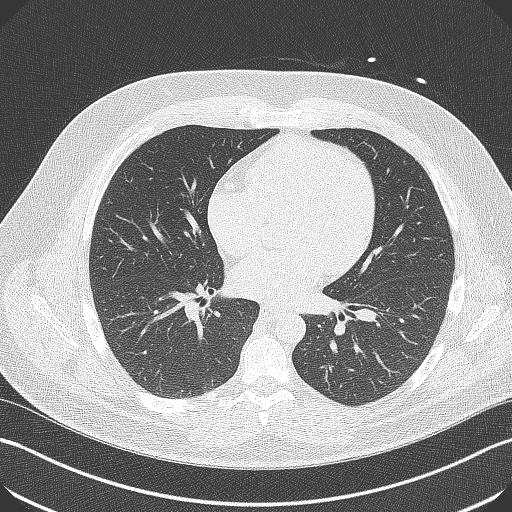
[im 39/59  lung]
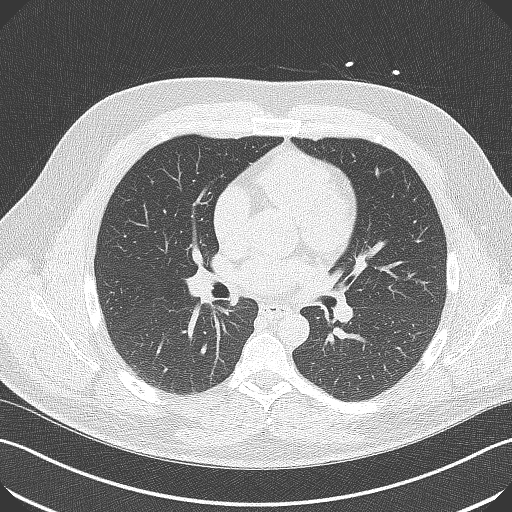
[im 49/59  lung]
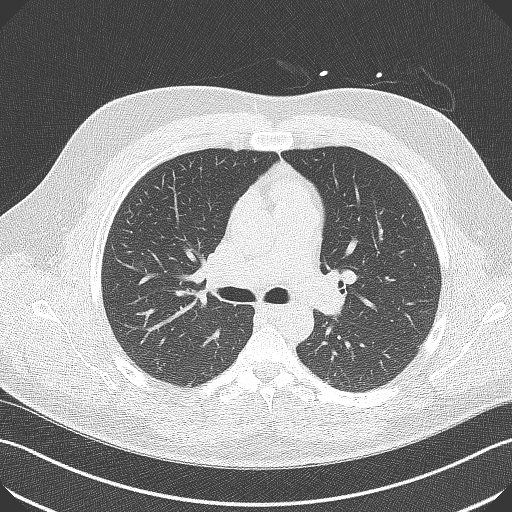

[14 of 20 positions shown; findings below may reference images not displayed]

FINDINGS: Coronary arteries: Normal origins.

Coronary Calcium Score:

Left main: 0

Left anterior descending artery:

Left circumflex artery: 0

Right coronary artery: 0

Total:

Percentile: 76th

Pericardium: Normal.

Ascending Aorta: Normal caliber.

Non-cardiac: See separate report from [REDACTED].
IMPRESSION: Coronary calcium score of 4.99. This was 76th percentile for age-,
race-, and sex-matched controls.



If CAC=0, it is reasonable to withhold statin therapy and reassess
in 5 to 10 years, as long as higher risk conditions are absent
(diabetes mellitus, family history of premature CHD in first degree
relatives (males <55 years; females <65 years), cigarette smoking,
or LDL >=190 mg/dL).

If CAC is 1 to 99, it is reasonable to initiate statin therapy for
patients >=55 years of age.

If CAC is >=100 or >=75th percentile, it is reasonable to initiate
statin therapy at any age.

Cardiology referral should be considered for patients with CAC
scores >=400 or >=75th percentile.

*9919 AHA/ACC/AACVPR/AAPA/ABC/IRUSHAAM/RICHES/AMIR KAZ/Tigist/IBE/MOH/ES
Guideline on the Management of Blood Cholesterol: A Report of the
American College of Cardiology/American Heart Association Task Force
on Clinical Practice Guidelines. J Am Coll Cardiol.
8478;73(24):0156-0182.

EXAM:
OVER-READ INTERPRETATION  CT CHEST

The following report is an over-read performed by radiologist Dr.
over-read does not include interpretation of cardiac or coronary
anatomy or pathology. The coronary calcium score interpretation by
the cardiologist is attached.
FINDINGS: No significant noncardiac vascular findings. Visualized mediastinum
demonstrates mild thymic remnant tissue in the anterior mediastinum.
No visualized lymphadenopathy. Visualized lungs show no evidence of
pulmonary edema, consolidation, pneumothorax, nodule or pleural
fluid. Visualized upper abdomen and bony structures are
unremarkable.
IMPRESSION: No significant incidental findings. Thymic remnant tissue in the
anterior mediastinum appears benign.

*** End of Addendum ***
FINDINGS: Coronary arteries: Normal origins.

Coronary Calcium Score:

Left main: 0

Left anterior descending artery:

Left circumflex artery: 0

Right coronary artery: 0

Total:

Percentile: 76th

Pericardium: Normal.

Ascending Aorta: Normal caliber.

Non-cardiac: See separate report from [REDACTED].
IMPRESSION: Coronary calcium score of 4.99. This was 76th percentile for age-,
race-, and sex-matched controls.



If CAC=0, it is reasonable to withhold statin therapy and reassess
in 5 to 10 years, as long as higher risk conditions are absent
(diabetes mellitus, family history of premature CHD in first degree
relatives (males <55 years; females <65 years), cigarette smoking,
or LDL >=190 mg/dL).

If CAC is 1 to 99, it is reasonable to initiate statin therapy for
patients >=55 years of age.

If CAC is >=100 or >=75th percentile, it is reasonable to initiate
statin therapy at any age.

Cardiology referral should be considered for patients with CAC
scores >=400 or >=75th percentile.

*9919 AHA/ACC/AACVPR/AAPA/ABC/IRUSHAAM/RICHES/AMIR KAZ/Tigist/IBE/MOH/ES
Guideline on the Management of Blood Cholesterol: A Report of the
American College of Cardiology/American Heart Association Task Force
on Clinical Practice Guidelines. J Am Coll Cardiol.
8478;73(24):0156-0182.

## 2023-08-13 ENCOUNTER — Other Ambulatory Visit (HOSPITAL_COMMUNITY): Payer: Self-pay

## 2023-08-13 ENCOUNTER — Telehealth: Payer: Self-pay | Admitting: Pharmacy Technician

## 2023-08-13 NOTE — Telephone Encounter (Signed)
 Pharmacy Patient Advocate Encounter   Received notification from Onbase that prior authorization for REPATHA is required/requested.   Insurance verification completed.   The patient is insured through [facility] .   Per test claim: PA required; PA submitted to above mentioned insurance via CoverMyMeds Key/confirmation #/EOC [id number] Status is pending

## 2023-08-14 ENCOUNTER — Other Ambulatory Visit (HOSPITAL_COMMUNITY): Payer: Self-pay

## 2023-08-14 NOTE — Telephone Encounter (Signed)
 Pharmacy Patient Advocate Encounter  Received notification from [facility] that Prior Authorization for repatha has been APPROVED from 08/29/23 to 08/28/24. Unable to obtain price due to refill too soon rejection, last fill date 07/28/23 next available fill date4/15/25  This is a renewal pa  PA #/Case ID/Reference #: [id number]

## 2023-08-24 ENCOUNTER — Other Ambulatory Visit: Payer: Self-pay

## 2023-08-24 ENCOUNTER — Other Ambulatory Visit: Payer: Self-pay | Admitting: Cardiovascular Disease

## 2023-08-24 MED ORDER — ROSUVASTATIN CALCIUM 10 MG PO TABS: ORAL_TABLET | ORAL | 2 refills | Status: AC

## 2024-04-23 ENCOUNTER — Telehealth: Payer: Self-pay | Admitting: Cardiology

## 2024-04-23 DIAGNOSIS — E78019 Familial hypercholesterolemia, unspecified: Secondary | ICD-10-CM

## 2024-04-23 MED ORDER — REPATHA SURECLICK 140 MG/ML ~~LOC~~ SOAJ: 140.0000 mg | SUBCUTANEOUS | 1 refills | Status: DC

## 2024-04-23 NOTE — Telephone Encounter (Signed)
°*  STAT* If patient is at the pharmacy, call can be transferred to refill team.   1. Which medications need to be refilled? (please list name of each medication and dose if known)   Evolocumab  (REPATHA  SURECLICK) 140 MG/ML SOAJ     4. Which pharmacy/location (including street and city if local pharmacy) is medication to be sent to?  [facility] [phone] - [place],  - [address]     5. Do they need a 30 day or 90 day supply? 90    Sch 05/27/24

## 2024-04-23 NOTE — Telephone Encounter (Signed)
 Refills has been sent to the pharmacy.

## 2024-05-25 NOTE — Progress Notes (Unsigned)
 " Cardiology Office Note:    Date:  05/27/2024   ID:  Jason Osborne, DOB 1986/05/12, MRN [id number]  PCP:  Seena Thom POUR, MD  Cardiologist:  Debby Sor, MD (Inactive)  Electrophysiologist:  None   Referring MD: Montey Lot, PA-C   Chief Complaint  Patient presents with   Coronary Artery Disease    History of Present Illness:    Jason Osborne is a 39 y.o. male with a hx of hyperlipidemia who presents for follow-up.  Previously followed with Dr. Sor.  Calcium  score 07/2021 was 5 (percentile not available for age under 58 but would be 91st percentile for age 70).  Since last clinic visit, he reports he is doing  well.  Denies any chest pain, dyspnea, lightheadedness, syncope, lower extremity edema, or palpitations.  Reports has not been exercising much outside of work but works for [employer] and gets a lot of steps at work.   Past Medical History:  Diagnosis Date   Hyperlipidemia     Past Surgical History:  Procedure Laterality Date   HAND SURGERY Left    VASECTOMY      Current Medications: Active Medications[1]   Allergies:   Patient has no known allergies.   Social History   Socioeconomic History   Marital status: Married    Spouse name: Not on file   Number of children: Not on file   Years of education: Not on file   Highest education level: Not on file  Occupational History   Not on file  Tobacco Use   Smoking status: Never   Smokeless tobacco: Never  Vaping Use   Vaping status: Never Used  Substance and Sexual Activity   Alcohol use: No   Drug use: No   Sexual activity: Not on file  Other Topics Concern   Not on file  Social History Narrative   Not on file   Social Drivers of Health   Tobacco Use: Low Risk (04/30/2024)   Received from [facility]   Patient History    Smoking Tobacco Use: Never    Smokeless Tobacco Use: Never    Passive Exposure: Never  Financial Resource Strain: Not on file  Food Insecurity: No Food Insecurity  (04/30/2024)   Received from [facility]   Epic    Within the past 12 months, you worried that your food would run out before you got the money to buy more.: Never true    Within the past 12 months, the food you bought just didn't last and you didn't have money to get more.: Never true  Transportation Needs: No Transportation Needs (04/30/2024)   Received from [facility]   PRAPARE - Transportation    Lack of Transportation (Medical): No    Lack of Transportation (Non-Medical): No  Physical Activity: Not on file  Stress: Not on file  Social Connections: Not on file  Depression (EYV7-0): Not on file  Alcohol Screen: Not on file  Housing: Not on file  Utilities: Low Risk (04/30/2024)   Received from [facility]   Utilities    Within the past 12 months, have you been unable to get utilities(heat, electricity) when it was really needed?: No  Health Literacy: Not on file     Family History: The patient's family history includes Cancer - Lung in his maternal grandfather; Heart attack (age of onset: 62) in his father; Heart disease in his father and paternal grandfather; Hyperlipidemia in his brother, brother, maternal grandfather, and  paternal grandfather; Hypertension in his maternal grandfather, mother, and paternal grandfather; Melanoma in his paternal grandfather.  ROS:   Please see the history of present illness.     All other systems reviewed and are negative.  EKGs/Labs/Other Studies Reviewed:    The following studies were reviewed today:   EKG:   05/27/2024: Normal sinus rhythm, rate 72, no ST abnormalities  Recent Labs: No results found for requested labs within last 365 days.  Recent Lipid Panel    Component Value Date/Time   CHOL 109 11/02/2021 0842   TRIG 65 11/02/2021 0842   HDL 48 11/02/2021 0842   CHOLHDL 2.3 11/02/2021 0842   CHOLHDL 3.7 09/22/2016 0847   VLDL 16 09/22/2016 0847   LDLCALC 47 11/02/2021 0842    Physical Exam:    VS:  BP 106/78  (BP Location: Right Arm, Patient Position: Sitting, Cuff Size: Large)   Pulse 72   Ht 5' 8 (1.727 m)   Wt 236 lb (107 kg)   SpO2 96%   BMI 35.88 kg/m     Wt Readings from Last 3 Encounters:  05/27/24 236 lb (107 kg)  05/24/23 242 lb 6.4 oz (110 kg)  11/08/21 241 lb 9.6 oz (109.6 kg)     GEN:  Well nourished, well developed in no acute distress HEENT: Normal NECK: No JVD; No carotid bruits CARDIAC: RRR, no murmurs, rubs, gallops RESPIRATORY:  Clear to auscultation without rales, wheezing or rhonchi  ABDOMEN: Soft, non-tender, non-distended MUSCULOSKELETAL:  No edema; No deformity  SKIN: Warm and dry NEUROLOGIC:  Alert and oriented x 3 PSYCHIATRIC:  Normal affect   ASSESSMENT:    1. Coronary artery disease involving native coronary artery of native heart without angina pectoris   2. Family history of early CAD   3. Familial hypercholesterolemia    PLAN:    CAD: Calcium  score 07/2021 was 5 (percentile not available for age under 76 but would be 91st percentile for age 23).  Denies any anginal symptoms.  - Continue Repatha , rosuvastatin  and Zetia   Hyperlipidemia: History of familial hyperlipidemia, father died of MI at age 54.  Calcium  score results as above.  He was started on statins at age 70.  Currently on Repatha , rosuvastatin  10 mg every other day, and Zetia  10 mg daily.  LDL 64 on 04/30/2024.  LP(a) less than 8.4 on 05/12/2022.   -Continue current regimen  RTC in 1 year   Medication Adjustments/Labs and Tests Ordered: Current medicines are reviewed at length with the patient today.  Concerns regarding medicines are outlined above.  Orders Placed This Encounter  Procedures   EKG 12-Lead   Meds ordered this encounter  Medications   Evolocumab  (REPATHA  SURECLICK) 140 MG/ML SOAJ    Sig: Inject 140 mg into the skin every 14 (fourteen) days.    Dispense:  6 mL    Refill:  3    ZERO refills remain on this prescription. Your patient is requesting advance approval of  refills for this medication to PREVENT ANY MISSED DOSES    Patient Instructions  Medication Instructions:  Your physician recommends that you continue on your current medications as directed. Please refer to the Current Medication list given to you today.   *If you need a refill on your cardiac medications before your next appointment, please call your pharmacy*  Lab Work: NONE  Testing/Procedures: NONE  Follow-Up: At [facility], you and your health needs are our priority.  As part of our continuing mission to provide  you with exceptional heart care, our providers are all part of one team.  This team includes your primary Cardiologist (physician) and Advanced Practice Providers or APPs (Physician Assistants and Nurse Practitioners) who all work together to provide you with the care you need, when you need it.  Your next appointment:   12 month(s)  Provider:   DR KATE   We recommend signing up for the patient portal called MyChart.  Sign up information is provided on this After Visit Summary.  MyChart is used to connect with patients for Virtual Visits (Telemedicine).  Patients are able to view lab/test results, encounter notes, upcoming appointments, etc.  Non-urgent messages can be sent to your provider as well.   To learn more about what you can do with MyChart, go to [email or web address].   Other Instructions            Signed, Lonni LITTIE Kate, MD  05/27/2024 8:35 AM    [facility]     [1]  Current Meds  Medication Sig   Desloratadine (CLARINEX PO) Take 1 tablet by mouth daily.   EPINEPHrine  0.3 mg/0.3 mL IJ SOAJ injection    ezetimibe  (ZETIA ) 10 MG tablet TAKE 1 TABLET(10 MG) BY MOUTH DAILY   fluticasone  (FLONASE ) 50 MCG/ACT nasal spray Place 1 spray into both nostrils daily.   levocetirizine (XYZAL) 5 MG tablet SMARTSIG:1 Tablet(s) By Mouth Every Evening   montelukast (SINGULAIR) 10 MG tablet Take 10 mg by mouth  at bedtime.   NON FORMULARY Allergy injection   omeprazole (PRILOSEC) 10 MG capsule Take 10 mg by mouth daily.   rosuvastatin  (CRESTOR ) 10 MG tablet TAKE 1 TABLET(10 MG) BY MOUTH EVERY OTHER DAY   [DISCONTINUED] Evolocumab  (REPATHA  SURECLICK) 140 MG/ML SOAJ Inject 140 mg into the skin every 14 (fourteen) days.   "

## 2024-05-27 ENCOUNTER — Ambulatory Visit: Attending: Cardiology | Admitting: Cardiology

## 2024-05-27 VITALS — BP 106/78 | HR 72 | Ht 68.0 in | Wt 236.0 lb

## 2024-05-27 DIAGNOSIS — Z8249 Family history of ischemic heart disease and other diseases of the circulatory system: Secondary | ICD-10-CM

## 2024-05-27 DIAGNOSIS — I2510 Atherosclerotic heart disease of native coronary artery without angina pectoris: Secondary | ICD-10-CM | POA: Diagnosis not present

## 2024-05-27 DIAGNOSIS — E78019 Familial hypercholesterolemia, unspecified: Secondary | ICD-10-CM

## 2024-05-27 MED ORDER — REPATHA SURECLICK 140 MG/ML ~~LOC~~ SOAJ: 140.0000 mg | SUBCUTANEOUS | 3 refills | Status: AC

## 2024-05-27 NOTE — Patient Instructions (Signed)
 Medication Instructions:  Your physician recommends that you continue on your current medications as directed. Please refer to the Current Medication list given to you today.   *If you need a refill on your cardiac medications before your next appointment, please call your pharmacy*  Lab Work: NONE  Testing/Procedures: NONE  Follow-Up: At [facility], you and your health needs are our priority.  As part of our continuing mission to provide you with exceptional heart care, our providers are all part of one team.  This team includes your primary Cardiologist (physician) and Advanced Practice Providers or APPs (Physician Assistants and Nurse Practitioners) who all work together to provide you with the care you need, when you need it.  Your next appointment:   12 month(s)  Provider:   DR KATE   We recommend signing up for the patient portal called MyChart.  Sign up information is provided on this After Visit Summary.  MyChart is used to connect with patients for Virtual Visits (Telemedicine).  Patients are able to view lab/test results, encounter notes, upcoming appointments, etc.  Non-urgent messages can be sent to your provider as well.   To learn more about what you can do with MyChart, go to [email or web address].   Other Instructions
